# Patient Record
Sex: Female | Born: 1990 | Race: White | Hispanic: No | Marital: Married | Smoking: Never smoker
Health system: Southern US, Community
[De-identification: ages and names within clinical notes are randomized; demographics above are authoritative.]

## PROBLEM LIST (undated history)

## (undated) DIAGNOSIS — L409 Psoriasis, unspecified: Secondary | ICD-10-CM

## (undated) DIAGNOSIS — Z8489 Family history of other specified conditions: Secondary | ICD-10-CM

## (undated) DIAGNOSIS — L4050 Arthropathic psoriasis, unspecified: Secondary | ICD-10-CM

## (undated) HISTORY — DX: Psoriasis, unspecified: L40.9

## (undated) HISTORY — DX: Family history of other specified conditions: Z84.89

## (undated) HISTORY — PX: NO PAST SURGERIES: SHX2092

---

## 2010-11-26 DIAGNOSIS — K259 Gastric ulcer, unspecified as acute or chronic, without hemorrhage or perforation: Secondary | ICD-10-CM

## 2010-11-26 HISTORY — DX: Gastric ulcer, unspecified as acute or chronic, without hemorrhage or perforation: K25.9

## 2018-11-03 ENCOUNTER — Encounter: Payer: Self-pay | Admitting: Family Medicine

## 2018-11-03 ENCOUNTER — Ambulatory Visit (INDEPENDENT_AMBULATORY_CARE_PROVIDER_SITE_OTHER): Payer: Managed Care, Other (non HMO) | Admitting: Family Medicine

## 2018-11-03 VITALS — BP 118/56 | HR 57 | Resp 17 | Ht 67.0 in | Wt 158.6 lb

## 2018-11-03 DIAGNOSIS — M79644 Pain in right finger(s): Secondary | ICD-10-CM

## 2018-11-03 DIAGNOSIS — Z13220 Encounter for screening for lipoid disorders: Secondary | ICD-10-CM | POA: Diagnosis not present

## 2018-11-03 DIAGNOSIS — Z3202 Encounter for pregnancy test, result negative: Secondary | ICD-10-CM

## 2018-11-03 DIAGNOSIS — Z1389 Encounter for screening for other disorder: Secondary | ICD-10-CM | POA: Diagnosis not present

## 2018-11-03 DIAGNOSIS — Z1329 Encounter for screening for other suspected endocrine disorder: Secondary | ICD-10-CM | POA: Diagnosis not present

## 2018-11-03 DIAGNOSIS — L409 Psoriasis, unspecified: Secondary | ICD-10-CM

## 2018-11-03 DIAGNOSIS — M2550 Pain in unspecified joint: Secondary | ICD-10-CM | POA: Diagnosis not present

## 2018-11-03 DIAGNOSIS — Z131 Encounter for screening for diabetes mellitus: Secondary | ICD-10-CM | POA: Diagnosis not present

## 2018-11-03 DIAGNOSIS — Z7689 Persons encountering health services in other specified circumstances: Secondary | ICD-10-CM

## 2018-11-03 DIAGNOSIS — M7918 Myalgia, other site: Secondary | ICD-10-CM

## 2018-11-03 DIAGNOSIS — Z0000 Encounter for general adult medical examination without abnormal findings: Secondary | ICD-10-CM | POA: Diagnosis not present

## 2018-11-03 DIAGNOSIS — Z130 Encounter for screening for diseases of the blood and blood-forming organs and certain disorders involving the immune mechanism: Secondary | ICD-10-CM

## 2018-11-03 DIAGNOSIS — Z0001 Encounter for general adult medical examination with abnormal findings: Secondary | ICD-10-CM

## 2018-11-03 LAB — POCT URINALYSIS DIP (CLINITEK)
Bilirubin, UA: NEGATIVE
Blood, UA: NEGATIVE
Glucose, UA: NEGATIVE mg/dL
Ketones, POC UA: NEGATIVE mg/dL
Leukocytes, UA: NEGATIVE
NITRITE UA: NEGATIVE
PH UA: 6 (ref 5.0–8.0)
POC PROTEIN,UA: NEGATIVE
Spec Grav, UA: 1.02 (ref 1.010–1.025)
UROBILINOGEN UA: 0.2 U/dL

## 2018-11-03 LAB — POCT URINE PREGNANCY: Preg Test, Ur: NEGATIVE

## 2018-11-03 MED ORDER — MELOXICAM 15 MG PO TABS: 15.0000 mg | ORAL_TABLET | Freq: Every day | ORAL | 0 refills | Status: DC

## 2018-11-03 NOTE — Patient Instructions (Addendum)
Thank you for choosing Primary Care at [facility] to be your medical home!    Lindsay Huynh was seen by Molli Barrows, FNP today.   Lindsay Huynh's primary care provider is Scot Jun, FNP.   For the best care possible, you should try to see Molli Barrows, FNP-C whenever you come to the clinic.   We look forward to seeing you again soon!  If you have any questions about your visit today, please call us at [phone] or feel free to reach your primary care provider via [place].     Joint Pain Joint pain can be caused by many things. The joint can be bruised, infected, weak from aging, or sore from exercise. The pain will probably go away if you follow your doctor's instructions for home care. If your joint pain continues, more tests may be needed to help find the cause of your condition. Follow these instructions at home: Watch your condition for any changes. Follow these instructions as told to lessen the pain that you are feeling:  Take medicines only as told by your doctor.  Rest the sore joint for as long as told by your doctor. If your doctor tells you to, raise (elevate) the painful joint above the level of your heart while you are sitting or lying down.  Do not do things that cause pain or make the pain worse.  If told, put ice on the painful area: ? Put ice in a plastic bag. ? Place a towel between your skin and the bag. ? Leave the ice on for 20 minutes, 2-3 times per day.  Wear an elastic bandage, splint, or sling as told by your doctor. Loosen the bandage or splint if your fingers or toes lose feeling (become numb) and tingle, or if they turn cold and blue.  Begin exercising or stretching the joint as told by your doctor. Ask your doctor what types of exercise are safe for you.  Keep all follow-up visits as told by your doctor. This is important.  Contact a doctor if:  Your pain gets worse and medicine does not help it.  Your joint pain does not get  better in 3 days.  You have more bruising or swelling.  You have a fever.  You lose 10 pounds (4.5 kg) or more without trying. Get help right away if:  You are not able to move the joint.  Your fingers or toes become numb or they turn cold and blue. This information is not intended to replace advice given to you by your health care provider. Make sure you discuss any questions you have with your health care provider. Document Released: 10/31/2009 Document Revised: 04/19/2016 Document Reviewed: 08/24/2014 Elsevier Interactive Patient Education  2018 [place] Maintenance, Female Adopting a healthy lifestyle and getting preventive care can go a long way to promote health and wellness. Talk with your health care provider about what schedule of regular examinations is right for you. This is a good chance for you to check in with your provider about disease prevention and staying healthy. In between checkups, there are plenty of things you can do on your own. Experts have done a lot of research about which lifestyle changes and preventive measures are most likely to keep you healthy. Ask your health care provider for more information. Weight and diet Eat a healthy diet  Be sure to include plenty of vegetables, fruits, low-fat dairy products, and lean protein.  Do not eat a lot  of foods high in solid fats, added sugars, or salt.  Get regular exercise. This is one of the most important things you can do for your health. ? Most adults should exercise for at least 150 minutes each week. The exercise should increase your heart rate and make you sweat (moderate-intensity exercise). ? Most adults should also do strengthening exercises at least twice a week. This is in addition to the moderate-intensity exercise.  Maintain a healthy weight  Body mass index (BMI) is a measurement that can be used to identify possible weight problems. It estimates body fat based on height and weight.  Your health care provider can help determine your BMI and help you achieve or maintain a healthy weight.  For females 88 years of age and older: ? A BMI below 18.5 is considered underweight. ? A BMI of 18.5 to 24.9 is normal. ? A BMI of 25 to 29.9 is considered overweight. ? A BMI of 30 and above is considered obese.  Watch levels of cholesterol and blood lipids  You should start having your blood tested for lipids and cholesterol at 28 years of age, then have this test every 5 years.  You may need to have your cholesterol levels checked more often if: ? Your lipid or cholesterol levels are high. ? You are older than 28 years of age. ? You are at high risk for heart disease.  Cancer screening Lung Cancer  Lung cancer screening is recommended for adults 34-64 years old who are at high risk for lung cancer because of a history of smoking.  A yearly low-dose CT scan of the lungs is recommended for people who: ? Currently smoke. ? Have quit within the past 15 years. ? Have at least a 30-pack-year history of smoking. A pack year is smoking an average of one pack of cigarettes a day for 1 year.  Yearly screening should continue until it has been 15 years since you quit.  Yearly screening should stop if you develop a health problem that would prevent you from having lung cancer treatment.  Breast Cancer  Practice breast self-awareness. This means understanding how your breasts normally appear and feel.  It also means doing regular breast self-exams. Let your health care provider know about any changes, no matter how small.  If you are in your 20s or 30s, you should have a clinical breast exam (CBE) by a health care provider every 1-3 years as part of a regular health exam.  If you are 46 or older, have a CBE every year. Also consider having a breast X-ray (mammogram) every year.  If you have a family history of breast cancer, talk to your health care provider about genetic  screening.  If you are at high risk for breast cancer, talk to your health care provider about having an MRI and a mammogram every year.  Breast cancer gene (BRCA) assessment is recommended for women who have family members with BRCA-related cancers. BRCA-related cancers include: ? Breast. ? Ovarian. ? Tubal. ? Peritoneal cancers.  Results of the assessment will determine the need for genetic counseling and BRCA1 and BRCA2 testing.  Cervical Cancer Your health care provider may recommend that you be screened regularly for cancer of the pelvic organs (ovaries, uterus, and vagina). This screening involves a pelvic examination, including checking for microscopic changes to the surface of your cervix (Pap test). You may be encouraged to have this screening done every 3 years, beginning at age 43.  For women  ages 50-65, health care providers may recommend pelvic exams and Pap testing every 3 years, or they may recommend the Pap and pelvic exam, combined with testing for human papilloma virus (HPV), every 5 years. Some types of HPV increase your risk of cervical cancer. Testing for HPV may also be done on women of any age with unclear Pap test results.  Other health care providers may not recommend any screening for nonpregnant women who are considered low risk for pelvic cancer and who do not have symptoms. Ask your health care provider if a screening pelvic exam is right for you.  If you have had past treatment for cervical cancer or a condition that could lead to cancer, you need Pap tests and screening for cancer for at least 20 years after your treatment. If Pap tests have been discontinued, your risk factors (such as having a new sexual partner) need to be reassessed to determine if screening should resume. Some women have medical problems that increase the chance of getting cervical cancer. In these cases, your health care provider may recommend more frequent screening and Pap  tests.  Colorectal Cancer  This type of cancer can be detected and often prevented.  Routine colorectal cancer screening usually begins at 28 years of age and continues through 28 years of age.  Your health care provider may recommend screening at an earlier age if you have risk factors for colon cancer.  Your health care provider may also recommend using home test kits to check for hidden blood in the stool.  A small camera at the end of a tube can be used to examine your colon directly (sigmoidoscopy or colonoscopy). This is done to check for the earliest forms of colorectal cancer.  Routine screening usually begins at age 4.  Direct examination of the colon should be repeated every 5-10 years through 28 years of age. However, you may need to be screened more often if early forms of precancerous polyps or small growths are found.  Skin Cancer  Check your skin from head to toe regularly.  Tell your health care provider about any new moles or changes in moles, especially if there is a change in a mole's shape or color.  Also tell your health care provider if you have a mole that is larger than the size of a pencil eraser.  Always use sunscreen. Apply sunscreen liberally and repeatedly throughout the day.  Protect yourself by wearing long sleeves, pants, a wide-brimmed hat, and sunglasses whenever you are outside.  Heart disease, diabetes, and high blood pressure  High blood pressure causes heart disease and increases the risk of stroke. High blood pressure is more likely to develop in: ? People who have blood pressure in the high end of the normal range (130-139/85-89 mm Hg). ? People who are overweight or obese. ? People who are African American.  If you are 58-46 years of age, have your blood pressure checked every 3-5 years. If you are 53 years of age or older, have your blood pressure checked every year. You should have your blood pressure measured twice-once when you are at  a hospital or clinic, and once when you are not at a hospital or clinic. Record the average of the two measurements. To check your blood pressure when you are not at a hospital or clinic, you can use: ? An automated blood pressure machine at a pharmacy. ? A home blood pressure monitor.  If you are between 55 years and 74  years old, ask your health care provider if you should take aspirin to prevent strokes.  Have regular diabetes screenings. This involves taking a blood sample to check your fasting blood sugar level. ? If you are at a normal weight and have a low risk for diabetes, have this test once every three years after 28 years of age. ? If you are overweight and have a high risk for diabetes, consider being tested at a younger age or more often. Preventing infection Hepatitis B  If you have a higher risk for hepatitis B, you should be screened for this virus. You are considered at high risk for hepatitis B if: ? You were born in a country where hepatitis B is common. Ask your health care provider which countries are considered high risk. ? Your parents were born in a high-risk country, and you have not been immunized against hepatitis B (hepatitis B vaccine). ? You have HIV or AIDS. ? You use needles to inject street drugs. ? You live with someone who has hepatitis B. ? You have had sex with someone who has hepatitis B. ? You get hemodialysis treatment. ? You take certain medicines for conditions, including cancer, organ transplantation, and autoimmune conditions.  Hepatitis C  Blood testing is recommended for: ? Everyone born from [birth year] through 1966. ? Anyone with known risk factors for hepatitis C.  Sexually transmitted infections (STIs)  You should be screened for sexually transmitted infections (STIs) including gonorrhea and chlamydia if: ? You are sexually active and are younger than 28 years of age. ? You are older than 28 years of age and your health care provider tells  you that you are at risk for this type of infection. ? Your sexual activity has changed since you were last screened and you are at an increased risk for chlamydia or gonorrhea. Ask your health care provider if you are at risk.  If you do not have HIV, but are at risk, it may be recommended that you take a prescription medicine daily to prevent HIV infection. This is called pre-exposure prophylaxis (PrEP). You are considered at risk if: ? You are sexually active and do not regularly use condoms or know the HIV status of your partner(s). ? You take drugs by injection. ? You are sexually active with a partner who has HIV.  Talk with your health care provider about whether you are at high risk of being infected with HIV. If you choose to begin PrEP, you should first be tested for HIV. You should then be tested every 3 months for as long as you are taking PrEP. Pregnancy  If you are premenopausal and you may become pregnant, ask your health care provider about preconception counseling.  If you may become pregnant, take 400 to 800 micrograms (mcg) of folic acid every day.  If you want to prevent pregnancy, talk to your health care provider about birth control (contraception). Osteoporosis and menopause  Osteoporosis is a disease in which the bones lose minerals and strength with aging. This can result in serious bone fractures. Your risk for osteoporosis can be identified using a bone density scan.  If you are 63 years of age or older, or if you are at risk for osteoporosis and fractures, ask your health care provider if you should be screened.  Ask your health care provider whether you should take a calcium or vitamin D supplement to lower your risk for osteoporosis.  Menopause may have certain physical symptoms and risks.  Hormone replacement therapy may reduce some of these symptoms and risks. Talk to your health care provider about whether hormone replacement therapy is right for  you. Follow these instructions at home:  Schedule regular health, dental, and eye exams.  Stay current with your immunizations.  Do not use any tobacco products including cigarettes, chewing tobacco, or electronic cigarettes.  If you are pregnant, do not drink alcohol.  If you are breastfeeding, limit how much and how often you drink alcohol.  Limit alcohol intake to no more than 1 drink per day for nonpregnant women. One drink equals 12 ounces of beer, 5 ounces of wine, or 1 ounces of hard liquor.  Do not use street drugs.  Do not share needles.  Ask your health care provider for help if you need support or information about quitting drugs.  Tell your health care provider if you often feel depressed.  Tell your health care provider if you have ever been abused or do not feel safe at home. This information is not intended to replace advice given to you by your health care provider. Make sure you discuss any questions you have with your health care provider. Document Released: 05/28/2011 Document Revised: 04/19/2016 Document Reviewed: 08/16/2015 Elsevier Interactive Patient Education  [employer].

## 2018-11-03 NOTE — Progress Notes (Signed)
Lindsay Huynh, is a 27 y.o. female  [id number]  [id number]  DOB - 20-Feb-1991  CC:  Chief Complaint  Patient presents with  . Establish Care  . Joint Pain    mostly in her fingers/toes. toes have been bothering her awhile but the finger pain has been over the last 2.5 months. unsure if it's related to her psoriasis. would like rheum referral       HPI: Lindsay Huynh is a 27 y.o. female is here today to establish care.   Lindsay Huynh does not have a problem list on file.   Today's visit:  Lindsay Huynh is here to establish care. She has had no recent follow-up with PCP. She has lived in [place] for approximately 3 years and has only been followed by gynecology. She takes oral contraceptives for birth control. 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 Has seen dermatology once. Lesions are localized to her scalp when they occur.  She complains today of idiopathic joint pain which has waxed and waned for over 1 year. Pain originated in toes of bilateral toes and persisted for sometime, and resolved. Pain subsequently developed in left middle finger and is now in the left index finger at the PIP joint. Joint is warm and reddened when pain is at it's most intense. Complains of ankle crepitus without pain. Occasional knee pain which she related to history of playing softball. Denies occurrence of rashes or fever. No family history of autoimmune disorders, no hair thinning or rash. Suffers from occasional acid reflux symptoms which are managed with OTC medications.   Patient denies new headaches, chest pain, abdominal pain, nausea, new weakness , numbness or tingling, SOB, edema, or worrisome cough.     Current medications: Current Outpatient Medications:  .  TRI-PREVIFEM 0.18/0.215/0.25 MG-35 MCG tablet, Take 1 tablet by mouth daily., Disp: , Rfl: 3   Pertinent family medical history: family history is not on file.   No Known  Allergies  Social History   Socioeconomic History  . Marital status: Married    Spouse name: Not on file  . Number of children: Not on file  . Years of education: Not on file  . Highest education level: Not on file  Occupational History  . Not on file  Social Needs  . Financial resource strain: Not on file  . Food insecurity:    Worry: Not on file    Inability: Not on file  . Transportation needs:    Medical: Not on file    Non-medical: Not on file  Tobacco Use  . Smoking status: Never Smoker  . Smokeless tobacco: Never Used  Substance and Sexual Activity  . Alcohol use: Yes    Comment: socially  . Drug use: Never  . Sexual activity: Not on file  Lifestyle  . Physical activity:    Days per week: Not on file    Minutes per session: Not on file  . Stress: Not on file  Relationships  . Social connections:    Talks on phone: Not on file    Gets together: Not on file    Attends religious service: Not on file    Active member of club or organization: Not on file    Attends meetings of clubs or organizations: Not on file    Relationship status: Not on file  . Intimate partner violence:    Fear of current or ex partner: Not on file    Emotionally  abused: Not on file    Physically abused: Not on file    Forced sexual activity: Not on file  Other Topics Concern  . Not on file  Social History Narrative  . Not on file    Review of Systems: Constitutional: Negative for fever, chills, diaphoresis, activity change, appetite change and fatigue. HENT: Negative for ear pain, nosebleeds, congestion, facial swelling, rhinorrhea, neck pain, neck stiffness and ear discharge.  Eyes: Negative for pain, discharge, redness, itching and visual disturbance. Respiratory: Negative for cough, choking, chest tightness, shortness of breath, wheezing and stridor.  Cardiovascular: Negative for chest pain, palpitations and leg swelling. Gastrointestinal: occasional acid reflux   Musculoskeletal: Negative for back pain, joint swelling, arthralgia and gait problem. Neurological: Negative for dizziness, tremors, seizures, syncope, facial asymmetry, speech difficulty, weakness, light-headedness, numbness and headaches.  Hematological: Negative for adenopathy. Does not bruise/bleed easily. Psychiatric/Behavioral: Negative for hallucinations, behavioral problems, confusion, dysphoric mood, decreased concentration and agitation.    Objective:   Vitals:   11/03/18 0847  BP: (!) 118/56  Pulse: (!) 57  Resp: 17  SpO2: 99%    BP Readings from Last 3 Encounters:  11/03/18 (!) 118/56    Filed Weights   11/03/18 0847  Weight: 158 lb 9.6 oz (71.9 kg)      Physical Exam: Constitutional: Patient appears well-developed and well-nourished. No distress. HENT: Normocephalic, atraumatic, External right and left ear normal. Oropharynx is clear and moist.  Eyes: Conjunctivae and EOM are normal. PERRLA, no scleral icterus. Neck: Normal ROM. Neck supple. No JVD. No tracheal deviation. No thyromegaly. CVS: RRR, S1/S2 +, no murmurs, no gallops, no carotid bruit.  Pulmonary: Effort and breath sounds normal, no stridor, rhonchi, wheezes, rales.  Abdominal: Soft. BS +, no distension, tenderness, rebound or guarding.  Musculoskeletal: Normal range of motion. No edema and no tenderness.  Neuro: Alert. Normal muscle tone coordination. Normal gait. BUE and BLE strength 5/5. Bilateral hand grips symmetrical. No cranial nerve deficit. Skin: Skin is warm and dry. No rash noted. Not diaphoretic. No erythema. No pallor. Psychiatric: Normal mood and affect. Behavior, judgment, thought content normal.     Assessment and plan:  1. Encounter to establish care 2. Generalized joint pain, rule out autoimmune etiology vs gout.  Will check the following: - VITAMIN D 25 Hydroxy (Vit-D Deficiency, Fractures); Standing - RA Qn+CCP(IgG/A)+SjoSSA+SjoSSB - Sedimentation Rate - Uric Acid -  C-reactive protein - Lupus (SLE) Analysis  3. Screening for thyroid disorder - Thyroid Panel With TSH  4. Screening for diabetes mellitus - Hemoglobin A1c - Comprehensive metabolic panel  5. Screening, lipid - Lipid panel  6. Screening for deficiency anemia - CBC with Differential  7. Annual physical exam Age appropriate anticipatory guidance provided  - POCT URINALYSIS DIP (CLINITEK) - POCT urine pregnancy   Will follow-up with labs results and refer to rheumatology or orthopedics as warranted.  Return in about 1 year (around 11/04/2019) for Complete Physical Exam or sooner if needed .   The patient was given clear instructions to go to ER or return to medical center if symptoms don't improve, worsen or new problems develop. The patient verbalized understanding. The patient was advised  to call and obtain lab results if they haven't heard anything from out office within 7-10 business days.  Joaquin CourtsKimberly Bindu Docter, FNP [facility] [address], [place] [id number] [phone]: [phone]    This note has been created with Dragon speech recognition software and Paediatric nursesmart phrase technology. Any transcriptional errors are  unintentional.

## 2018-11-04 LAB — LIPID PANEL
CHOL/HDL RATIO: 3.1 ratio (ref 0.0–4.4)
Cholesterol, Total: 165 mg/dL (ref 100–199)
HDL: 54 mg/dL (ref >39)
LDL Calculated: 89 mg/dL (ref 0–99)
Triglycerides: 110 mg/dL (ref 0–149)
VLDL Cholesterol Cal: 22 mg/dL (ref 5–40)

## 2018-11-04 LAB — COMPREHENSIVE METABOLIC PANEL
A/G RATIO: 1.7 (ref 1.2–2.2)
ALT: 10 IU/L (ref 0–32)
AST: 20 IU/L (ref 0–40)
Albumin: 4.3 g/dL (ref 3.5–5.5)
Alkaline Phosphatase: 61 IU/L (ref 39–117)
BILIRUBIN TOTAL: 0.6 mg/dL (ref 0.0–1.2)
BUN/Creatinine Ratio: 11 (ref 9–23)
BUN: 10 mg/dL (ref 6–20)
CHLORIDE: 104 mmol/L (ref 96–106)
CO2: 20 mmol/L (ref 20–29)
Calcium: 9.5 mg/dL (ref 8.7–10.2)
Creatinine, Ser: 0.93 mg/dL (ref 0.57–1.00)
GFR calc Af Amer: 97 mL/min/{1.73_m2} (ref >59)
GFR calc non Af Amer: 84 mL/min/{1.73_m2} (ref >59)
Globulin, Total: 2.5 g/dL (ref 1.5–4.5)
Glucose: 81 mg/dL (ref 65–99)
Potassium: 5.6 mmol/L — ABNORMAL HIGH (ref 3.5–5.2)
Sodium: 143 mmol/L (ref 134–144)
Total Protein: 6.8 g/dL (ref 6.0–8.5)

## 2018-11-04 LAB — LUPUS ANTICOAGULANT PANEL

## 2018-11-04 LAB — CBC WITH DIFFERENTIAL/PLATELET
Basophils Absolute: 0.1 10*3/uL (ref 0.0–0.2)
Basos: 1 %
EOS (ABSOLUTE): 0.4 10*3/uL (ref 0.0–0.4)
Eos: 6 %
HEMOGLOBIN: 12.8 g/dL (ref 11.1–15.9)
Hematocrit: 40 % (ref 34.0–46.6)
Immature Grans (Abs): 0 10*3/uL (ref 0.0–0.1)
Immature Granulocytes: 0 %
Lymphocytes Absolute: 2 10*3/uL (ref 0.7–3.1)
Lymphs: 27 %
MCH: 29.3 pg (ref 26.6–33.0)
MCHC: 32 g/dL (ref 31.5–35.7)
MCV: 92 fL (ref 79–97)
MONOCYTES: 9 %
Monocytes Absolute: 0.7 10*3/uL (ref 0.1–0.9)
Neutrophils Absolute: 4.1 10*3/uL (ref 1.4–7.0)
Neutrophils: 57 %
Platelets: 359 10*3/uL (ref 150–450)
RBC: 4.37 x10E6/uL (ref 3.77–5.28)
RDW: 12.6 % (ref 12.3–15.4)
WBC: 7.2 10*3/uL (ref 3.4–10.8)

## 2018-11-04 LAB — SEDIMENTATION RATE: Sed Rate: 8 mm/hr (ref 0–32)

## 2018-11-04 LAB — HEMOGLOBIN A1C
Est. average glucose Bld gHb Est-mCnc: 111 mg/dL
Hgb A1c MFr Bld: 5.5 % (ref 4.8–5.6)

## 2018-11-04 LAB — LUPUS (SLE) ANALYSIS
Anti Nuclear Antibody(ANA): NEGATIVE
Anti-striation Abs: NEGATIVE
Complement C4, Serum: 23 mg/dL (ref 14–44)
ENA RNP Ab: <0.2 AI (ref 0.0–0.9)
ENA SSA (RO) Ab: <0.2 AI (ref 0.0–0.9)
ENA SSB (LA) Ab: <0.2 AI (ref 0.0–0.9)
Parietal Cell Ab: 7.6 Units (ref 0.0–20.0)
Scleroderma SCL-70: <0.2 AI (ref 0.0–0.9)
Smooth Muscle Ab: 12 Units (ref 0–19)
Thyroperoxidase Ab SerPl-aCnc: 34 IU/mL (ref 0–34)
dsDNA Ab: <1 IU/mL (ref 0–9)

## 2018-11-04 LAB — RA QN+CCP(IGG/A)+SJOSSA+SJOSSB
Cyclic Citrullin Peptide Ab: 8 units (ref 0–19)
ENA SSA (RO) Ab: <0.2 AI (ref 0.0–0.9)
ENA SSB (LA) Ab: <0.2 AI (ref 0.0–0.9)
Rheumatoid fact SerPl-aCnc: <10 IU/mL (ref 0.0–13.9)

## 2018-11-04 LAB — THYROID PANEL WITH TSH
Free Thyroxine Index: 2 (ref 1.2–4.9)
T3 Uptake Ratio: 26 % (ref 24–39)
T4 TOTAL: 7.8 ug/dL (ref 4.5–12.0)
TSH: 2.7 u[IU]/mL (ref 0.450–4.500)

## 2018-11-04 LAB — C-REACTIVE PROTEIN: CRP: 7 mg/L (ref 0–10)

## 2018-11-04 LAB — URIC ACID: Uric Acid: 3.4 mg/dL (ref 2.5–7.1)

## 2018-11-05 ENCOUNTER — Telehealth: Payer: Self-pay | Admitting: Family Medicine

## 2018-11-05 DIAGNOSIS — E875 Hyperkalemia: Secondary | ICD-10-CM

## 2018-11-05 DIAGNOSIS — M25542 Pain in joints of left hand: Secondary | ICD-10-CM

## 2018-11-05 MED ORDER — FUROSEMIDE 20 MG PO TABS: 20.0000 mg | ORAL_TABLET | Freq: Every day | ORAL | 0 refills | Status: DC

## 2018-11-05 NOTE — Telephone Encounter (Signed)
Rheumatoid factor, lupus panel, sed rate, C-reactive protein, and uric acid levels were all normal.  These test analyze the presence of inflammation within the body and all were negative.  I would recommend following up with an image of your index finger to evaluate if this is a musculoskeletal problem.  Given these results rheumatology would not be an appropriate referral however orthopedics would be more appropriate if the joint pain continues to be persistent or pain is level now that you would like an orthopedics evaluation. Please advise if you would like imaging and othopedic referral. All other labs were normal with the exception you potassium level was mildly elevated. I would like for her to take 3 days of furosemide 20 mg and return 1 week for a repeat potassium check. Medication sent to pharmacy on file and lab is pending

## 2018-11-07 NOTE — Addendum Note (Signed)
Addended by: Bing NeighborsHARRIS, Vianna Venezia S on: 11/07/2018 04:06 PM   Modules accepted: Orders

## 2018-11-07 NOTE — Telephone Encounter (Signed)
Patient notified of lab results & recommendations. She states that she would like to have the xray done first & then proceed with orthopedics if necessary.

## 2018-11-07 NOTE — Telephone Encounter (Signed)
Order placed for imaging. If [facility] imaging is set-up patient and have [facility] performed at our office [facility]. Notify patient

## 2018-11-10 NOTE — Telephone Encounter (Signed)
Patient can make an appt here for xray.

## 2018-11-23 ENCOUNTER — Other Ambulatory Visit: Payer: Self-pay | Admitting: Family Medicine

## 2018-11-24 NOTE — Telephone Encounter (Signed)
[facility] does not currently refill prescriptions for this practice. / [facility]

## 2018-12-17 ENCOUNTER — Ambulatory Visit: Payer: Self-pay

## 2018-12-17 ENCOUNTER — Ambulatory Visit (INDEPENDENT_AMBULATORY_CARE_PROVIDER_SITE_OTHER): Payer: Managed Care, Other (non HMO)

## 2018-12-17 DIAGNOSIS — M25542 Pain in joints of left hand: Secondary | ICD-10-CM | POA: Diagnosis not present

## 2018-12-17 DIAGNOSIS — M85842 Other specified disorders of bone density and structure, left hand: Secondary | ICD-10-CM | POA: Diagnosis not present

## 2018-12-18 ENCOUNTER — Telehealth: Payer: Self-pay | Admitting: Family Medicine

## 2018-12-18 NOTE — Telephone Encounter (Signed)
Contact patient to advise recent imaging of the index finger did reveal mild decreased loss of bone density.  I do believe this warrants further evaluation given her age.  I am referring her to orthopedic specialty for further work-up and for a  second opinion of cause of loss in bone mass.

## 2018-12-18 NOTE — Telephone Encounter (Signed)
Patient notified of lab results & recommendations. Expressed understanding. Aware she will be being contacted by Ortho to set up an appointment.

## 2018-12-19 NOTE — Progress Notes (Unsigned)
Patient in office for xray.

## 2018-12-25 ENCOUNTER — Telehealth: Payer: Self-pay | Admitting: Family Medicine

## 2018-12-25 NOTE — Telephone Encounter (Signed)
Referral placed to orthopedic speciality that has access to Epic. No need to fax imaging. Thanks!

## 2018-12-25 NOTE — Telephone Encounter (Signed)
I have printed xray report & last office note in preparation to fax to Ortho.  Can you enter referral please? Thanks.

## 2018-12-25 NOTE — Addendum Note (Signed)
Addended by: Bing NeighborsHARRIS, Debroh Sieloff S on: 12/25/2018 08:15 PM   Modules accepted: Orders

## 2018-12-25 NOTE — Telephone Encounter (Signed)
Patient called requesting status of referral to ortho, referral has not been sent in yet please follow up.

## 2019-01-05 ENCOUNTER — Ambulatory Visit (INDEPENDENT_AMBULATORY_CARE_PROVIDER_SITE_OTHER): Payer: Managed Care, Other (non HMO) | Admitting: Orthopaedic Surgery

## 2019-01-05 ENCOUNTER — Encounter (INDEPENDENT_AMBULATORY_CARE_PROVIDER_SITE_OTHER): Payer: Self-pay | Admitting: Orthopaedic Surgery

## 2019-01-05 VITALS — BP 120/60 | HR 65 | Ht 67.0 in | Wt 160.0 lb

## 2019-01-05 DIAGNOSIS — M79642 Pain in left hand: Secondary | ICD-10-CM | POA: Diagnosis not present

## 2019-01-05 NOTE — Progress Notes (Signed)
Office Visit Note   Patient: Lindsay Huynh           Date of Birth: 1991-08-02           MRN: [id number] Visit Date: 01/05/2019              Requested by: Bing Neighbors, FNP [address] [place], [place] [address] PCP: Bing Neighbors, FNP   Assessment & Plan: Visit Diagnoses:  1. 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  Discussed with Dr.Deveshwar.  Will order additional lab follow-up with Dr Corliss Skains  Follow-Up Instructions: Return with Dr Corliss Skains after lab.   Orders:  Orders Placed This Encounter  Procedures  . CK  . IgG, IgA, IgM  . QuantiFERON-TB Gold Plus  . Serum protein electrophoresis with reflex  . Glucose 6 phosphate dehydrogenase  . HIV Antibody (routine testing w rflx)  . Hepatitis panel, acute   No orders of the defined types were placed in this encounter.     Procedures: No procedures performed   Clinical Data: No additional findings.   Subjective: Chief Complaint  Patient presents with  . Left Hand - Pain  Patient presents today with left index finger pain and swelling. She said that it has been hurting for 3-6months. No injury. She said that the pain is constant, but worse in the morning and when it is cold. She saw her PCP Dr.Harris in December of 2019. She had an x-ray on 12/17/2018 of her left hand. She is not taking anything for pain, but has tried a magnesium cream on her cream. She has also noticed some right ankle swelling and pain in the last two weeks. She said that it hurts more in the morning and wonders if they are connected. Has history of psoriatic lesions in her scalp.  No specific injury or trauma.  Also has noted some pain in the area of her sacroiliac joints. I did review the films of her left hand on the PACS system.  No obvious deformity or irregularity about the PIP joint of the left index finger  HPI  Review of  Systems   Objective: Vital Signs: BP 120/60   Pulse 65   Ht 5\' 7"  (1.702 m)   Wt 160 lb (72.6 kg)   BMI 25.06 kg/m   Physical Exam Constitutional:      Appearance: She is well-developed.  Eyes:     Pupils: Pupils are equal, round, and reactive to light.  Pulmonary:     Effort: Pulmonary effort is normal.  Skin:    General: Skin is warm and dry.  Neurological:     Mental Status: She is alert and oriented to person, place, and time.  Psychiatric:        Behavior: Behavior normal.     Ortho Exam awake alert and oriented x3.  Cuff pulse sitting.  Examination left hand reveals some mild swelling of the PIP joint.  Able to make a full fist but a little "tight".  No pitting of the nails.  No MP joint pain or DIP joint pain.  Neurologically intact.  No other PIP DIP or MP joint discomfort in left hand or right.  Right ankle with some swelling along the medial more than the lateral malleolus that she might even have a small ganglion cyst along the malleolus.  Neurologically intact.  Very minimal discomfort.  No nail problems  Specialty Comments:  No specialty comments available.  Imaging: No results found.   PMFS History: Patient Active Problem List   Diagnosis Date Noted  . Generalized joint pain 11/03/2018  . Psoriasis 11/03/2018   Past Medical History:  Diagnosis Date  . Psoriasis     History reviewed. No pertinent family history.  History reviewed. No pertinent surgical history. Social History   Occupational History  . Not on file  Tobacco Use  . Smoking status: Never Smoker  . Smokeless tobacco: Never Used  Substance and Sexual Activity  . Alcohol use: Yes    Comment: socially  . Drug use: Never  . Sexual activity: Not on file

## 2019-01-07 LAB — QUANTIFERON-TB GOLD PLUS
Mitogen-NIL: >10 IU/mL
NIL: 0.05 IU/mL
QUANTIFERON-TB GOLD PLUS: NEGATIVE
TB1-NIL: <0 IU/mL
TB2-NIL: <0 IU/mL

## 2019-01-07 LAB — HEPATITIS PANEL, ACUTE
Hep A IgM: NONREACTIVE
Hep B C IgM: NONREACTIVE
Hepatitis B Surface Ag: NONREACTIVE
Hepatitis C Ab: NONREACTIVE
SIGNAL TO CUT-OFF: 0.02 (ref <1.00)

## 2019-01-07 LAB — PROTEIN ELECTROPHORESIS, SERUM, WITH REFLEX
ALPHA 1: 0.4 g/dL — AB (ref 0.2–0.3)
ALPHA 2: 0.7 g/dL (ref 0.5–0.9)
Albumin ELP: 3.9 g/dL (ref 3.8–4.8)
Beta 2: 0.3 g/dL (ref 0.2–0.5)
Beta Globulin: 0.5 g/dL (ref 0.4–0.6)
Gamma Globulin: 1.1 g/dL (ref 0.8–1.7)
Total Protein: 6.9 g/dL (ref 6.1–8.1)

## 2019-01-07 LAB — IGG, IGA, IGM
IgG (Immunoglobin G), Serum: 1162 mg/dL (ref 600–1640)
IgM, Serum: 155 mg/dL (ref 50–300)
Immunoglobulin A: 135 mg/dL (ref 47–310)

## 2019-01-07 LAB — CK: Total CK: 58 U/L (ref 29–143)

## 2019-01-07 LAB — HIV ANTIBODY (ROUTINE TESTING W REFLEX): HIV: NONREACTIVE

## 2019-01-07 LAB — GLUCOSE 6 PHOSPHATE DEHYDROGENASE: G-6PDH: 16.4 U/g Hgb (ref 7.0–20.5)

## 2019-02-11 ENCOUNTER — Ambulatory Visit: Payer: Managed Care, Other (non HMO) | Admitting: Rheumatology

## 2019-02-26 IMAGING — DX DG HAND COMPLETE 3+V*L*
3 series · 3 of 3 positions shown · non-contrast
Comparison: None.

CLINICAL DATA: Swelling and joint pain for 2 months. Swelling and
irritation around the PIP joint of the second digit.

EXAM:
LEFT HAND - COMPLETE 3+ VIEW

[hand pa]
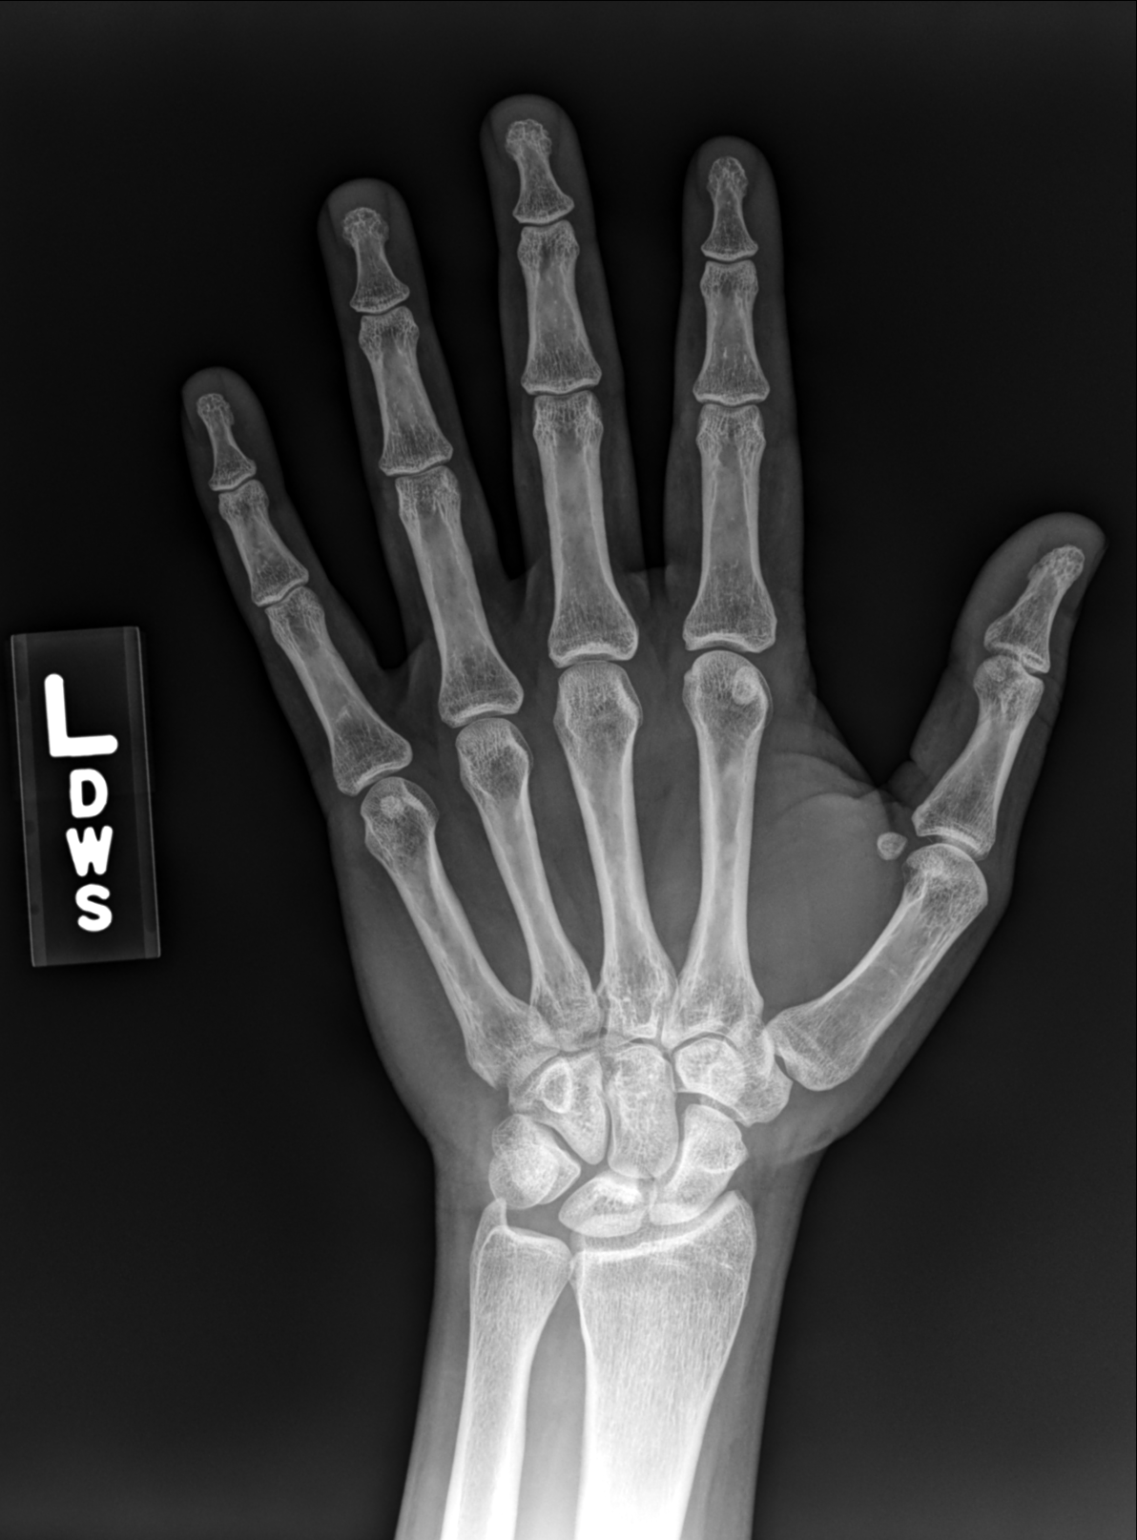

[hand mlo]
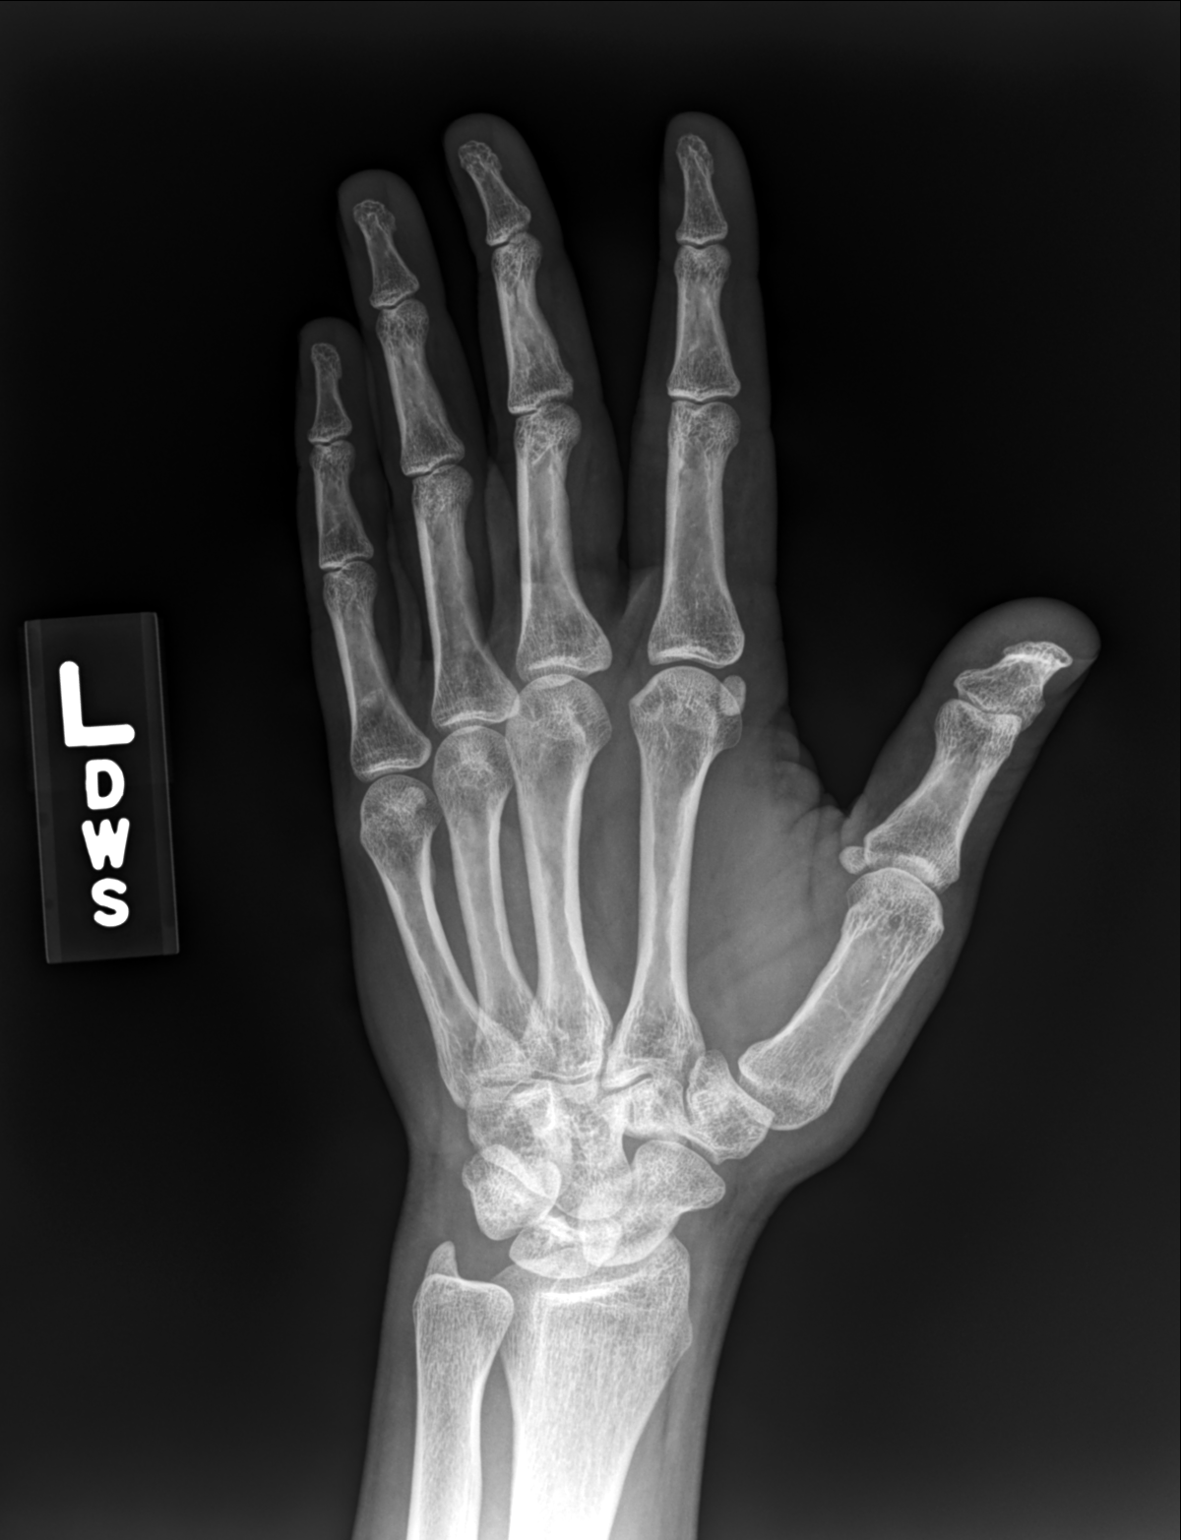

[hand lat]
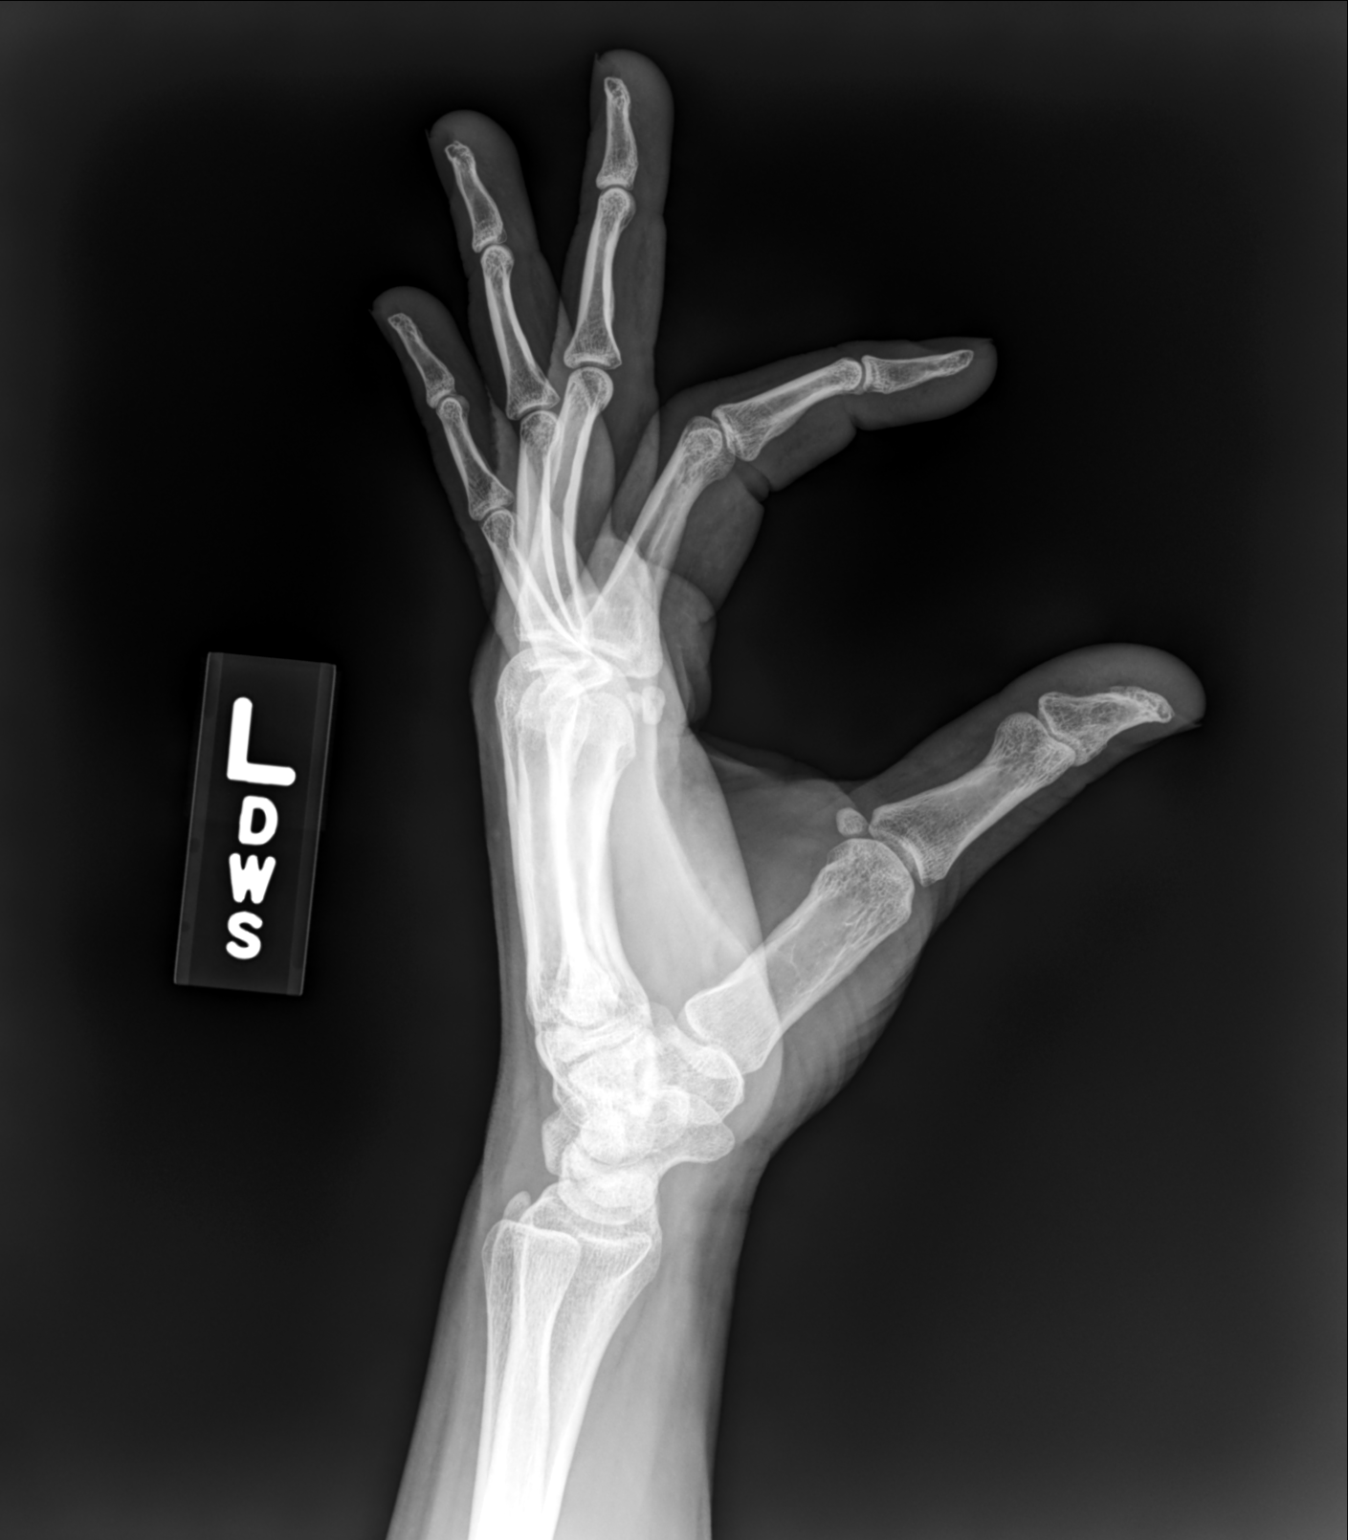

[3 of 3 positions shown; findings below may reference images not displayed]

FINDINGS: Slight periarticular osteopenia without erosive change or
periostitis. No acute fracture or suspicious osseous lesions. Carpal
rows are maintained. The distal radius and ulna are unremarkable. No
soft tissue mass or mineralization.
IMPRESSION: Slight periarticular osteopenia without erosive change or
periostitis. Findings are nonspecific but can be an early
radiographic sign of inflammatory arthropathy.

## 2019-03-09 ENCOUNTER — Ambulatory Visit: Payer: Managed Care, Other (non HMO) | Admitting: Rheumatology

## 2019-11-27 NOTE — L&D Delivery Note (Signed)
Operative Delivery Note Patient had been pushing effectively x 2 hours.  Recurrent variable decelerations with each contraction and loss of variability.  I recommend vacuum assistance to expedite delivery and patient accepts.    Verbal consent: obtained from patient.  Risks and benefits discussed in detail.  Risks include, but are not limited to the risks of anesthesia, bleeding, infection, damage to maternal tissues, fetal cephalhematoma.  There is also the risk of inability to effect vaginal delivery of the head, or shoulder dystocia that cannot be resolved by established maneuvers, leading to the need for emergency cesarean section.  At 3:37 PM a viable female was delivered via Vaginal, Vacuum Investment banker, operational).  Presentation: vertex; Position: Left,, Occiput,, Anterior; Station: +3.  Total of 5 contractions with 3 push/pull efforts each and steady descent; 2 pop offs.  APGAR: 8, 9; weight pending.   Placenta status: S, I.   3V Cord with the following complications: none.  Cord pH: n/a  Anesthesia:  CLEA Instruments: Kiwi vacuum Episiotomy: None Lacerations: 2nd degree Suture Repair: 3.0 vicryl rapide Est. Blood Loss (mL):  250  Mom to postpartum.  Baby to Couplet care / Skin to Skin.  Mitchel Honour 06/02/2020, 3:59 PM

## 2019-12-09 LAB — OB RESULTS CONSOLE RUBELLA ANTIBODY, IGM: Rubella: IMMUNE

## 2019-12-09 LAB — HEPATITIS C ANTIBODY: HCV Ab: NEGATIVE

## 2019-12-09 LAB — OB RESULTS CONSOLE HEPATITIS B SURFACE ANTIGEN: Hepatitis B Surface Ag: NEGATIVE

## 2019-12-09 LAB — OB RESULTS CONSOLE RPR: RPR: NONREACTIVE

## 2019-12-09 LAB — OB RESULTS CONSOLE GC/CHLAMYDIA
Chlamydia: NEGATIVE
Gonorrhea: NEGATIVE

## 2019-12-09 LAB — OB RESULTS CONSOLE ANTIBODY SCREEN: Antibody Screen: NEGATIVE

## 2019-12-09 LAB — OB RESULTS CONSOLE ABO/RH: RH Type: POSITIVE

## 2020-05-04 LAB — OB RESULTS CONSOLE GBS: GBS: NEGATIVE

## 2020-05-25 ENCOUNTER — Encounter (HOSPITAL_COMMUNITY): Payer: Self-pay | Admitting: *Deleted

## 2020-05-25 ENCOUNTER — Telehealth (HOSPITAL_COMMUNITY): Payer: Self-pay | Admitting: *Deleted

## 2020-05-25 NOTE — Telephone Encounter (Signed)
Preadmission screen  

## 2020-05-31 ENCOUNTER — Other Ambulatory Visit (HOSPITAL_COMMUNITY)
Admission: RE | Admit: 2020-05-31 | Discharge: 2020-05-31 | Disposition: A | Discharge status: Home / Self Care | Payer: Managed Care, Other (non HMO) | Source: Ambulatory Visit | Attending: Obstetrics & Gynecology | Admitting: Obstetrics & Gynecology

## 2020-05-31 LAB — SARS CORONAVIRUS 2 (TAT 6-24 HRS): SARS Coronavirus 2: NEGATIVE

## 2020-06-01 ENCOUNTER — Inpatient Hospital Stay (HOSPITAL_COMMUNITY): Payer: Managed Care, Other (non HMO)

## 2020-06-02 ENCOUNTER — Encounter (HOSPITAL_COMMUNITY): Payer: Self-pay | Admitting: Obstetrics & Gynecology

## 2020-06-02 ENCOUNTER — Encounter (HOSPITAL_COMMUNITY): Payer: Self-pay | Admitting: Anesthesiology

## 2020-06-02 ENCOUNTER — Inpatient Hospital Stay (HOSPITAL_COMMUNITY)
Admission: AD | Admit: 2020-06-02 | Discharge: 2020-06-04 | DRG: 807 | Disposition: A | Discharge status: Home / Self Care | Payer: Managed Care, Other (non HMO) | Attending: Obstetrics & Gynecology | Admitting: Obstetrics & Gynecology

## 2020-06-02 ENCOUNTER — Inpatient Hospital Stay (HOSPITAL_COMMUNITY): Payer: Managed Care, Other (non HMO) | Admitting: Anesthesiology

## 2020-06-02 ENCOUNTER — Other Ambulatory Visit: Payer: Self-pay

## 2020-06-02 ENCOUNTER — Inpatient Hospital Stay (HOSPITAL_COMMUNITY): Payer: Managed Care, Other (non HMO)

## 2020-06-02 DIAGNOSIS — L409 Psoriasis, unspecified: Secondary | ICD-10-CM | POA: Diagnosis present

## 2020-06-02 DIAGNOSIS — O26893 Other specified pregnancy related conditions, third trimester: Secondary | ICD-10-CM | POA: Diagnosis present

## 2020-06-02 DIAGNOSIS — Z3A40 40 weeks gestation of pregnancy: Secondary | ICD-10-CM | POA: Diagnosis not present

## 2020-06-02 DIAGNOSIS — L4050 Arthropathic psoriasis, unspecified: Secondary | ICD-10-CM | POA: Diagnosis present

## 2020-06-02 DIAGNOSIS — Z3490 Encounter for supervision of normal pregnancy, unspecified, unspecified trimester: Secondary | ICD-10-CM

## 2020-06-02 DIAGNOSIS — Z20822 Contact with and (suspected) exposure to covid-19: Secondary | ICD-10-CM | POA: Diagnosis present

## 2020-06-02 LAB — CBC
HCT: 37.9 % (ref 36.0–46.0)
Hemoglobin: 12.6 g/dL (ref 12.0–15.0)
MCH: 29.6 pg (ref 26.0–34.0)
MCHC: 33.2 g/dL (ref 30.0–36.0)
MCV: 89 fL (ref 80.0–100.0)
Platelets: 271 10*3/uL (ref 150–400)
RBC: 4.26 MIL/uL (ref 3.87–5.11)
RDW: 13.2 % (ref 11.5–15.5)
WBC: 11.5 10*3/uL — ABNORMAL HIGH (ref 4.0–10.5)
nRBC: 0 % (ref 0.0–0.2)

## 2020-06-02 LAB — RPR: RPR Ser Ql: NONREACTIVE

## 2020-06-02 LAB — ABO/RH: ABO/RH(D): O POS

## 2020-06-02 LAB — TYPE AND SCREEN
ABO/RH(D): O POS
Antibody Screen: NEGATIVE

## 2020-06-02 MED ORDER — ONDANSETRON HCL 4 MG/2ML IJ SOLN: 4.0000 mg | INTRAMUSCULAR | Status: DC | PRN

## 2020-06-02 MED ORDER — COCONUT OIL OIL: 1.0000 "application " | TOPICAL_OIL | Status: DC | PRN

## 2020-06-02 MED ORDER — LACTATED RINGERS IV SOLN
500.0000 mL | Freq: Once | INTRAVENOUS | Status: AC
  Administered 2020-06-02: 500 mL via INTRAVENOUS

## 2020-06-02 MED ORDER — DIBUCAINE (PERIANAL) 1 % EX OINT: 1.0000 "application " | TOPICAL_OINTMENT | CUTANEOUS | Status: DC | PRN

## 2020-06-02 MED ORDER — FENTANYL-BUPIVACAINE-NACL 0.5-0.125-0.9 MG/250ML-% EP SOLN
12.0000 mL/h | EPIDURAL | Status: DC | PRN
  Filled 2020-06-02: qty 250

## 2020-06-02 MED ORDER — OXYCODONE-ACETAMINOPHEN 5-325 MG PO TABS: 1.0000 | ORAL_TABLET | ORAL | Status: DC | PRN

## 2020-06-02 MED ORDER — SENNOSIDES-DOCUSATE SODIUM 8.6-50 MG PO TABS
2.0000 | ORAL_TABLET | ORAL | Status: DC
  Administered 2020-06-02 – 2020-06-04 (×2): 2 via ORAL
  Filled 2020-06-02 (×2): qty 2

## 2020-06-02 MED ORDER — FENTANYL-BUPIVACAINE-NACL 0.5-0.125-0.9 MG/250ML-% EP SOLN
12.0000 mL/h | EPIDURAL | Status: DC | PRN
Start: 2020-06-02 — End: 2020-06-02

## 2020-06-02 MED ORDER — ZOLPIDEM TARTRATE 5 MG PO TABS: 5.0000 mg | ORAL_TABLET | Freq: Every evening | ORAL | Status: DC | PRN

## 2020-06-02 MED ORDER — FENTANYL CITRATE (PF) 100 MCG/2ML IJ SOLN: 50.0000 ug | INTRAMUSCULAR | Status: DC | PRN

## 2020-06-02 MED ORDER — BENZOCAINE-MENTHOL 20-0.5 % EX AERO
1.0000 "application " | INHALATION_SPRAY | CUTANEOUS | Status: DC | PRN
  Administered 2020-06-02: 1 via TOPICAL
  Filled 2020-06-02: qty 56

## 2020-06-02 MED ORDER — OXYTOCIN-SODIUM CHLORIDE 30-0.9 UT/500ML-% IV SOLN: 2.5000 [IU]/h | INTRAVENOUS | Status: DC

## 2020-06-02 MED ORDER — TERBUTALINE SULFATE 1 MG/ML IJ SOLN
0.2500 mg | Freq: Once | INTRAMUSCULAR | Status: DC | PRN
  Filled 2020-06-02: qty 1

## 2020-06-02 MED ORDER — DIPHENHYDRAMINE HCL 25 MG PO CAPS: 25.0000 mg | ORAL_CAPSULE | Freq: Four times a day (QID) | ORAL | Status: DC | PRN

## 2020-06-02 MED ORDER — TERBUTALINE SULFATE 1 MG/ML IJ SOLN
0.2500 mg | Freq: Once | INTRAMUSCULAR | Status: AC | PRN
  Administered 2020-06-02: 0.25 mg via SUBCUTANEOUS

## 2020-06-02 MED ORDER — PRENATAL MULTIVITAMIN CH
1.0000 | ORAL_TABLET | Freq: Every day | ORAL | Status: DC
  Administered 2020-06-03 – 2020-06-04 (×2): 1 via ORAL
  Filled 2020-06-02 (×2): qty 1

## 2020-06-02 MED ORDER — LIDOCAINE HCL (PF) 1 % IJ SOLN
INTRAMUSCULAR | Status: DC | PRN
  Administered 2020-06-02: 6 mL via EPIDURAL

## 2020-06-02 MED ORDER — ACETAMINOPHEN 325 MG PO TABS: 650.0000 mg | ORAL_TABLET | ORAL | Status: DC | PRN

## 2020-06-02 MED ORDER — MISOPROSTOL 25 MCG QUARTER TABLET
25.0000 ug | ORAL_TABLET | ORAL | Status: DC | PRN
  Filled 2020-06-02: qty 1

## 2020-06-02 MED ORDER — PHENYLEPHRINE 40 MCG/ML (10ML) SYRINGE FOR IV PUSH (FOR BLOOD PRESSURE SUPPORT)
80.0000 ug | PREFILLED_SYRINGE | INTRAVENOUS | Status: AC | PRN
  Administered 2020-06-02 (×3): 80 ug via INTRAVENOUS

## 2020-06-02 MED ORDER — SIMETHICONE 80 MG PO CHEW: 80.0000 mg | CHEWABLE_TABLET | ORAL | Status: DC | PRN

## 2020-06-02 MED ORDER — ONDANSETRON HCL 4 MG/2ML IJ SOLN: 4.0000 mg | Freq: Four times a day (QID) | INTRAMUSCULAR | Status: DC | PRN

## 2020-06-02 MED ORDER — SOD CITRATE-CITRIC ACID 500-334 MG/5ML PO SOLN: 30.0000 mL | ORAL | Status: DC | PRN

## 2020-06-02 MED ORDER — LIDOCAINE HCL (PF) 1 % IJ SOLN: 30.0000 mL | INTRAMUSCULAR | Status: DC | PRN

## 2020-06-02 MED ORDER — LACTATED RINGERS IV SOLN: INTRAVENOUS | Status: DC

## 2020-06-02 MED ORDER — DIPHENHYDRAMINE HCL 50 MG/ML IJ SOLN: 12.5000 mg | INTRAMUSCULAR | Status: DC | PRN

## 2020-06-02 MED ORDER — ACETAMINOPHEN 325 MG PO TABS
650.0000 mg | ORAL_TABLET | ORAL | Status: DC | PRN
  Administered 2020-06-02: 650 mg via ORAL
  Filled 2020-06-02 (×2): qty 2

## 2020-06-02 MED ORDER — SODIUM CHLORIDE (PF) 0.9 % IJ SOLN
INTRAMUSCULAR | Status: DC | PRN
  Administered 2020-06-02: 12 mL/h via EPIDURAL

## 2020-06-02 MED ORDER — OXYCODONE-ACETAMINOPHEN 5-325 MG PO TABS: 2.0000 | ORAL_TABLET | ORAL | Status: DC | PRN

## 2020-06-02 MED ORDER — OXYTOCIN-SODIUM CHLORIDE 30-0.9 UT/500ML-% IV SOLN
1.0000 m[IU]/min | INTRAVENOUS | Status: DC
  Administered 2020-06-02: 2 m[IU]/min via INTRAVENOUS
  Filled 2020-06-02: qty 500

## 2020-06-02 MED ORDER — ONDANSETRON HCL 4 MG PO TABS: 4.0000 mg | ORAL_TABLET | ORAL | Status: DC | PRN

## 2020-06-02 MED ORDER — WITCH HAZEL-GLYCERIN EX PADS: 1.0000 "application " | MEDICATED_PAD | CUTANEOUS | Status: DC | PRN

## 2020-06-02 MED ORDER — EPHEDRINE 5 MG/ML INJ
10.0000 mg | INTRAVENOUS | Status: DC | PRN
  Administered 2020-06-02: 10 mg via INTRAVENOUS

## 2020-06-02 MED ORDER — OXYTOCIN BOLUS FROM INFUSION: 333.0000 mL | Freq: Once | INTRAVENOUS | Status: DC

## 2020-06-02 MED ORDER — EPHEDRINE 5 MG/ML INJ
10.0000 mg | INTRAVENOUS | Status: DC | PRN
  Filled 2020-06-02: qty 10

## 2020-06-02 MED ORDER — PHENYLEPHRINE 40 MCG/ML (10ML) SYRINGE FOR IV PUSH (FOR BLOOD PRESSURE SUPPORT)
80.0000 ug | PREFILLED_SYRINGE | INTRAVENOUS | Status: DC | PRN
  Filled 2020-06-02: qty 10

## 2020-06-02 MED ORDER — TETANUS-DIPHTH-ACELL PERTUSSIS 5-2.5-18.5 LF-MCG/0.5 IM SUSP: 0.5000 mL | Freq: Once | INTRAMUSCULAR | Status: DC

## 2020-06-02 MED ORDER — IBUPROFEN 600 MG PO TABS
600.0000 mg | ORAL_TABLET | Freq: Four times a day (QID) | ORAL | Status: DC
  Administered 2020-06-02 – 2020-06-04 (×7): 600 mg via ORAL
  Filled 2020-06-02 (×7): qty 1

## 2020-06-02 MED ORDER — LACTATED RINGERS IV SOLN: 500.0000 mL | INTRAVENOUS | Status: DC | PRN

## 2020-06-02 MED ORDER — DIPHENHYDRAMINE HCL 50 MG/ML IJ SOLN
12.5000 mg | INTRAMUSCULAR | Status: DC | PRN
Start: 2020-06-02 — End: 2020-06-02

## 2020-06-02 NOTE — Anesthesia Postprocedure Evaluation (Signed)
Anesthesia Post Note  Patient: Lindsay Huynh  Procedure(s) Performed: AN AD HOC LABOR EPIDURAL     Patient location during evaluation: Mother Baby Anesthesia Type: Epidural Level of consciousness: awake and alert Pain management: pain level controlled Vital Signs Assessment: post-procedure vital signs reviewed and stable Respiratory status: spontaneous breathing, nonlabored ventilation and respiratory function stable Cardiovascular status: stable Postop Assessment: no headache, no backache and epidural receding Anesthetic complications: no   No complications documented.  Last Vitals:  Vitals:   06/02/20 1646 06/02/20 1701  BP: (!) 103/47 (!) 101/53  Pulse: 87 80  Resp: 18 18  Temp:    SpO2:      Last Pain:  Vitals:   06/02/20 1701  TempSrc:   PainSc: 0-No pain   Pain Goal:                   Addam Goeller

## 2020-06-02 NOTE — H&P (Signed)
Lindsay Huynh is a 29 y.o. female G1 at [redacted]w[redacted]d presenting for induction of labor.  Pitocin was started at 5 am.  Patient reports feeling no CTX.  No VB or LOF.  Active FM.  Antepartum course complicated by psoriasis and psoriatic arthritis; well controlled this pregnancy with topical clobetasol.  GBS negative.    OB History    Gravida  1   Para      Term      Preterm      AB      Living        SAB      TAB      Ectopic      Multiple      Live Births             Past Medical History:  Diagnosis Date  . Family history of adverse reaction to anesthesia   . Psoriasis    Past Surgical History:  Procedure Laterality Date  . NO PAST SURGERIES     Family History: family history is not on file. Social History:  reports that she has never smoked. She has never used smokeless tobacco. She reports current alcohol use. She reports that she does not use drugs.     Maternal Diabetes: No Genetic Screening: Normal Maternal Ultrasounds/Referrals: Normal Fetal Ultrasounds or other Referrals:  None Maternal Substance Abuse:  No Significant Maternal Medications:  None Significant Maternal Lab Results:  Group B Strep negative Other Comments:  None  Review of Systems Maternal Medical History:  Fetal activity: Perceived fetal activity is normal.   Last perceived fetal movement was within the past hour.    Prenatal complications: no prenatal complications Prenatal Complications - Diabetes: none.    Dilation: 4 Effacement (%): 80 Station: -2 Exam by:: Dr. Langston Masker Blood pressure 122/67, pulse 64, temperature 98 F (36.7 C), temperature source Oral, resp. rate 18, height 5\' 7"  (1.702 m), weight 86.5 kg. Maternal Exam:  Uterine Assessment: Contraction strength is mild.  Contraction frequency is rare.   Abdomen: Patient reports no abdominal tenderness. Fundal height is c/w dates.   Estimated fetal weight is 8#.   Fetal presentation: vertex  Introitus: Normal vulva.  Amniotic fluid character: clear.  Pelvis: adequate for delivery.   Cervix: Cervix evaluated by digital exam.     Fetal Exam Fetal Monitor Review: Baseline rate: 140.  Variability: moderate (6-25 bpm).   Pattern: no decelerations and accelerations present.    Fetal State Assessment: Category I - tracings are normal.     Physical Exam Constitutional:      Appearance: Normal appearance.  HENT:     Head: Normocephalic and atraumatic.  Pulmonary:     Effort: Pulmonary effort is normal.  Abdominal:     Palpations: Abdomen is soft.  Genitourinary:    General: Normal vulva.  Musculoskeletal:     Cervical back: Normal range of motion and neck supple.  Skin:    General: Skin is warm and dry.  Neurological:     Mental Status: She is alert.  Psychiatric:        Mood and Affect: Mood normal.        Behavior: Behavior normal.     Prenatal labs: ABO, Rh: --/--/O POS Performed at [facility], [address] [place], [place]  [phone]) Antibody: NEG (07/08 0020) Rubella: Immune (01/13 0000) RPR: Nonreactive (01/13 0000)  HBsAg: Negative (01/13 0000)  HIV:   NR GBS: Negative/-- (06/09 0000)   Assessment/Plan:  28yo G1 at [redacted]w[redacted]d for IOL -Continue pitocin -S/P AROM for clear fluid -CLEA if desired -Anticipate NSVD   Mitchel Honour 06/02/2020, 8:32 AM

## 2020-06-02 NOTE — Progress Notes (Signed)
     Faculty [facility] OB/GYN Attending Note  Called to emergently evaluate patient with prolonged FHR deceleration to 80s for over 6 minutes. This occurred after epidural placement. Has received two doses of phenylephrine and one dose of terbutaline. On my arrival, FSE was being placed by Dr. Crissie Reese and other intrauterine neonatal resuscitative maneuvers were ongoing.  FHR was noted to be going back up to baseline of 120s.  Patient and her husband were calm.  Cervical exam revealed the patient to be fully dilated/+1-+2 station. Verbally consented patient for vacuum delivery vs cesarean delivery if decelerations continue. Also advised her to start pushing now.  Dr. Langston Masker soon arrived, I gave report to her and handed over the care of the patient.   Jaynie Collins, MD, FACOG Obstetrician & Gynecologist, [facility] [employer], [facility]

## 2020-06-02 NOTE — Anesthesia Procedure Notes (Signed)
Epidural Patient location during procedure: OB Start time: 06/02/2020 10:25 AM End time: 06/02/2020 10:30 AM  Staffing Anesthesiologist: Bethena Midget, MD  Preanesthetic Checklist Completed: patient identified, IV checked, site marked, risks and benefits discussed, surgical consent, monitors and equipment checked, pre-op evaluation and timeout performed  Epidural Patient position: sitting Prep: DuraPrep and site prepped and draped Patient monitoring: continuous pulse ox and blood pressure Approach: midline Location: L3-L4 Injection technique: LOR air  Needle:  Needle type: Tuohy  Needle gauge: 17 G Needle length: 9 cm and 9 Needle insertion depth: 5 cm cm Catheter type: closed end flexible Catheter size: 19 Gauge Catheter at skin depth: 10 cm Test dose: negative  Assessment Events: blood not aspirated, injection not painful, no injection resistance, no paresthesia and negative IV test

## 2020-06-02 NOTE — Progress Notes (Signed)
Received call at 1050 from RN that following CLEA placement, deceleration x 3 minutes was in progress.  I immediately left my office and arrived to LDR at 1059.  Dr. Lynetta Mare and Crissie Reese were present on my arrival.  Pitocin was off, terbulatine and phenylephrine had been given in addition to repositioning.  On my arrival, FHT were in 74s and patient was 10/c/+1 and had adequate anesthesia.  I pushed with the patient for a few contraction and given reassuring FHT with return to baseline of 155 bpm and variability restored, I will allow an hour to labor down and recover.    Mitchel Honour, DO

## 2020-06-02 NOTE — Anesthesia Preprocedure Evaluation (Signed)
Anesthesia Evaluation  Patient identified by MRN, date of birth, ID band Patient awake    Reviewed: Allergy & Precautions, H&P , NPO status , Patient's Chart, lab work & pertinent test results, reviewed documented beta blocker date and time   Airway Mallampati: II  TM Distance: >3 FB Neck ROM: full    Dental no notable dental hx. (+) Teeth Intact, Dental Advisory Given   Pulmonary neg pulmonary ROS,    Pulmonary exam normal breath sounds clear to auscultation       Cardiovascular negative cardio ROS Normal cardiovascular exam Rhythm:regular Rate:Normal     Neuro/Psych negative neurological ROS  negative psych ROS   GI/Hepatic negative GI ROS, Neg liver ROS,   Endo/Other  negative endocrine ROS  Renal/GU negative Renal ROS  negative genitourinary   Musculoskeletal   Abdominal   Peds  Hematology negative hematology ROS (+)   Anesthesia Other Findings   Reproductive/Obstetrics (+) Pregnancy                             Anesthesia Physical Anesthesia Plan  ASA: II  Anesthesia Plan: Epidural   Post-op Pain Management:    Induction:   PONV Risk Score and Plan:   Airway Management Planned:   Additional Equipment:   Intra-op Plan:   Post-operative Plan:   Informed Consent: I have reviewed the patients History and Physical, chart, labs and discussed the procedure including the risks, benefits and alternatives for the proposed anesthesia with the patient or authorized representative who has indicated his/her understanding and acceptance.     Dental Advisory Given  Plan Discussed with: Anesthesiologist  Anesthesia Plan Comments: (Labs checked- platelets confirmed with RN in room. Fetal heart tracing, per RN, reported to be stable enough for sitting procedure. Discussed epidural, and patient consents to the procedure:  included risk of possible headache,backache, failed block,  allergic reaction, and nerve injury. This patient was asked if she had any questions or concerns before the procedure started.)        Anesthesia Quick Evaluation  

## 2020-06-03 LAB — CBC
HCT: 33.5 % — ABNORMAL LOW (ref 36.0–46.0)
Hemoglobin: 11.1 g/dL — ABNORMAL LOW (ref 12.0–15.0)
MCH: 29.3 pg (ref 26.0–34.0)
MCHC: 33.1 g/dL (ref 30.0–36.0)
MCV: 88.4 fL (ref 80.0–100.0)
Platelets: 215 10*3/uL (ref 150–400)
RBC: 3.79 MIL/uL — ABNORMAL LOW (ref 3.87–5.11)
RDW: 13.4 % (ref 11.5–15.5)
WBC: 14.8 10*3/uL — ABNORMAL HIGH (ref 4.0–10.5)
nRBC: 0 % (ref 0.0–0.2)

## 2020-06-03 NOTE — Lactation Note (Addendum)
This note was copied from a baby's chart. Lactation Consultation Note  Patient Name: Lindsay Huynh [id number] Date: 06/03/2020  Reason for consult: Initial assessment;Primapara;1st time breastfeeding;Term  Patient instructed on how to do hand expression and was able to express drops of colostrom.  Mom instructed to pump to increase stimulation.  Infant had a latch score of 6 sleepy at the breast with latch on breast and nipple shield.  Infant sleepy after a bath. Brief intermittent suckling noted. Mom brought her own manual pump -- Lansinoh, Medela,  And Haakaa.   LC and patient discussed follow up tonight. Breastfeeding and basics reviewed.  Accompanied by Walker Shadow.    Maternal Data Has patient been taught Hand Expression?: Yes Does the patient have breastfeeding experience prior to this delivery?: No  Feeding Feeding Type: Breast Milk (colostrom expressed/infant lactched sleepy on nipple &shield)  LATCH Score Latch: Repeated attempts needed to sustain latch, nipple held in mouth throughout feeding, stimulation needed to elicit sucking reflex.  Audible Swallowing: None  Type of Nipple: Everted at rest and after stimulation  Comfort (Breast/Nipple): Soft / non-tender  Hold (Positioning): Assistance needed to correctly position infant at breast and maintain latch.  LATCH Score: 6  Interventions Interventions: Breast feeding basics reviewed;Assisted with latch;Skin to skin;Hand express;Adjust position;Position options  Lactation Tools Discussed/Used Pump Review:  (Mother encouraged to pump for stimulation. )   Consult Status Consult Status: Follow-up    Otelia Hettinger  Nicholson-Springer 06/03/2020, 11:10 AM

## 2020-06-03 NOTE — Discharge Summary (Signed)
Postpartum Discharge Summary  Date of Service updated 06/03/20     Patient Name: Lindsay Huynh DOB: 11-25-1991 MRN: [id number]  Date of admission: 06/02/2020 Delivery date:06/02/2020  Delivering provider: Linda Hedges  Date of discharge: 06/03/2020  Admitting diagnosis: Pregnancy [Z34.90] Intrauterine pregnancy: [redacted]w[redacted]d    Secondary diagnosis:  Active Problems:   Pregnancy  Additional problems: none    Discharge diagnosis: Term Pregnancy Delivered                                              Post partum procedures:none Augmentation: AROM and Pitocin Complications: None  Hospital course: Onset of Labor With Vaginal Delivery      29y.o. yo G1P1001 at 473w4das admitted for IOL. on 06/02/2020. Patient had an uncomplicated labor course as follows:  Membrane Rupture Time/Date: 7:32 AM ,06/02/2020   Delivery Method:Vaginal, Vacuum (Extractor)  Episiotomy: None  Lacerations:  2nd degree  Patient had an uncomplicated postpartum course.  She is ambulating, tolerating a regular diet, passing flatus, and urinating well. Patient is discharged home in stable condition on 06/03/20.  Newborn Data: Birth date:06/02/2020  Birth time:3:37 PM  Gender:Female  Living status:Living  Apgars:8 ,9  Weight:3550 g   Magnesium Sulfate received: No BMZ received: No Rhophylac:N/A MMR:N/A T-DaP:Given prenatally Flu: N/A Transfusion:No  Physical exam  Vitals:   06/02/20 2305 06/03/20 0335 06/03/20 0500 06/03/20 0803  BP: 119/60 (!) 107/53 106/60 (!) 105/58  Pulse: 60 67 60 62  Resp: _0 Temp: 98.1 F (36.7 C) 97.8 F (36.6 C) 97.6 F (36.4 C) 97.7 F (36.5 C)  TempSrc: Oral Oral Oral   SpO2: 100% 99% 99% 100%  Weight:      Height:       General: alert Lochia: appropriate Uterine Fundus: firm Incision: Healing well with no significant drainage DVT Evaluation: No evidence of DVT seen on physical exam. Labs: Lab Results  Component Value Date   WBC 14.8 (H) 06/03/2020   HGB  11.1 (L) 06/03/2020   HCT 33.5 (L) 06/03/2020   MCV 88.4 06/03/2020   PLT 215 06/03/2020   CMP Latest Ref Rng & Units 01/05/2019  Glucose 65 - 99 mg/dL -  BUN 6 - 20 mg/dL -  Creatinine 0.57 - 1.00 mg/dL -  Sodium 134 - 144 mmol/L -  Potassium 3.5 - 5.2 mmol/L -  Chloride 96 - 106 mmol/L -  CO2 20 - 29 mmol/L -  Calcium 8.7 - 10.2 mg/dL -  Total Protein 6.1 - 8.1 g/dL 6.9  Total Bilirubin 0.0 - 1.2 mg/dL -  Alkaline Phos 39 - 117 IU/L -  AST 0 - 40 IU/L -  ALT 0 - 32 IU/L -   Edinburgh Score: Edinburgh Postnatal Depression Scale Screening Tool 06/03/2020  I have been able to laugh and see the funny side of things. 0  I have looked forward with enjoyment to things. 0  I have blamed myself unnecessarily when things went wrong. 1  I have been anxious or worried for no good reason. 2  I have felt scared or panicky for no good reason. 2  Things have been getting on top of me. 1  I have been so unhappy that I have had difficulty sleeping. 1  I have felt sad or miserable. 1  I have been so unhappy that I have  been crying. 0  The thought of harming myself has occurred to me. 0  Edinburgh Postnatal Depression Scale Total 8      After visit meds:  Allergies as of 06/03/2020   No Known Allergies     Medication List    STOP taking these medications   meloxicam 15 MG tablet Commonly known as: MOBIC   Tri-Previfem 0.18/0.215/0.25 MG-35 MCG tablet Generic drug: Norgestimate-Ethinyl Estradiol Triphasic      Declines circ    Discharge home in stable condition Infant Feeding: Breast Infant Disposition:home with mother Discharge instruction: per After Visit Summary and Postpartum booklet. Activity: Advance as tolerated. Pelvic rest for 6 weeks.  Diet: routine diet Anticipated Birth Control: Unsure Postpartum Appointment:6 weeks Additional Postpartum F/U: n/a Future Appointments:No future appointments. Follow up Visit:      06/03/2020 Tyson Dense, MD

## 2020-06-04 NOTE — Progress Notes (Signed)
Patient seen and examined.  No changes. Please see DCS

## 2020-06-04 NOTE — Lactation Note (Signed)
This note was copied from a baby's chart. Lactation Consultation Note  Patient Name: Boy Candise Crabtree [id number] Date: 06/04/2020 Reason for consult: Follow-up assessment   Mother paged for [facility] assistance with latching infant. Infant alert and ready for feeding . Mother up in chair with pillows in cross cradle position.   Several attempts to latch infant on the base breast. Infant tugged a few tugs and pulled back. Mother reports that she has been having pain scale of #3 with latching.  Mother was fit with a #24 NS.    Infant latched with a good deep latch with wide open gape. Mother reports that latch feels much better. She denies pain .  Observed frequent suckles and swallows. With a good rhythmic suckling pattern.  Mother taught to do breast compression.   Infant was given 15 ml of donor milk at the breast with a curved tip syringe. Tolerated well. Several attempts to burp infant. Infant had a wet diaper. Parents were given supplemental guidelines. Father taught to finger feed infant with curved tip syringe. Infant was given another 5 ml .  Parents were given a foley cup for cup feeding.  Discussed cup feeding and paced bottle feeding using a wide base bottle nipple.   Staff nurse to sat up DEBP for mother to pump after feeding for 15-20 mins every 2-3 hours.  Encouraged parents to rouse infant before 3 hours if infant very sleepy for feedings. Mother to pump every 2-3 hours . Supplement infant with donor milk using guidelines.    Maternal Data    Feeding Feeding Type: Donor Breast Milk  LATCH Score Latch: Grasps breast easily, tongue down, lips flanged, rhythmical sucking.  Audible Swallowing: A few with stimulation  Type of Nipple: Everted at rest and after stimulation  Comfort (Breast/Nipple): Filling, red/small blisters or bruises, mild/mod discomfort  Hold (Positioning): Assistance needed to correctly position infant at breast and maintain latch.  LATCH Score:  7  Interventions Interventions: Assisted with latch;Skin to skin;Hand express;Breast compression;Adjust position;Support pillows;Position options  Lactation Tools Discussed/Used Tools: Nipple Shields Nipple shield size: 24   Consult Status Consult Status: Follow-up Date: 06/05/20 Follow-up type: In-patient    Stevan Born [facility] 06/04/2020, 12:07 PM

## 2020-09-06 ENCOUNTER — Other Ambulatory Visit: Payer: Managed Care, Other (non HMO)

## 2020-09-06 DIAGNOSIS — Z20822 Contact with and (suspected) exposure to covid-19: Secondary | ICD-10-CM

## 2020-09-07 LAB — SARS-COV-2, NAA 2 DAY TAT

## 2020-09-07 LAB — NOVEL CORONAVIRUS, NAA: SARS-CoV-2, NAA: NOT DETECTED

## 2021-08-07 LAB — OB RESULTS CONSOLE GC/CHLAMYDIA
Chlamydia: NEGATIVE
Gonorrhea: NEGATIVE

## 2021-08-07 LAB — OB RESULTS CONSOLE RPR: RPR: NONREACTIVE

## 2021-08-07 LAB — OB RESULTS CONSOLE RUBELLA ANTIBODY, IGM: Rubella: IMMUNE

## 2021-08-07 LAB — OB RESULTS CONSOLE ANTIBODY SCREEN: Antibody Screen: NEGATIVE

## 2021-08-07 LAB — OB RESULTS CONSOLE HEPATITIS B SURFACE ANTIGEN: Hepatitis B Surface Ag: NEGATIVE

## 2021-08-07 LAB — OB RESULTS CONSOLE ABO/RH: RH Type: POSITIVE

## 2021-08-07 LAB — HEPATITIS C ANTIBODY: HCV Ab: NEGATIVE

## 2021-08-07 LAB — OB RESULTS CONSOLE HIV ANTIBODY (ROUTINE TESTING): HIV: NONREACTIVE

## 2021-10-23 ENCOUNTER — Inpatient Hospital Stay (HOSPITAL_COMMUNITY)
Admission: AD | Admit: 2021-10-23 | Discharge: 2021-10-23 | Disposition: A | Discharge status: Home / Self Care | Payer: Managed Care, Other (non HMO) | Attending: Obstetrics and Gynecology | Admitting: Obstetrics and Gynecology

## 2021-10-23 ENCOUNTER — Encounter (HOSPITAL_COMMUNITY): Payer: Self-pay | Admitting: Obstetrics and Gynecology

## 2021-10-23 ENCOUNTER — Other Ambulatory Visit: Payer: Self-pay

## 2021-10-23 DIAGNOSIS — Z3A20 20 weeks gestation of pregnancy: Secondary | ICD-10-CM | POA: Insufficient documentation

## 2021-10-23 DIAGNOSIS — O26892 Other specified pregnancy related conditions, second trimester: Secondary | ICD-10-CM | POA: Diagnosis not present

## 2021-10-23 DIAGNOSIS — O219 Vomiting of pregnancy, unspecified: Secondary | ICD-10-CM | POA: Insufficient documentation

## 2021-10-23 DIAGNOSIS — O99891 Other specified diseases and conditions complicating pregnancy: Secondary | ICD-10-CM

## 2021-10-23 DIAGNOSIS — R519 Headache, unspecified: Secondary | ICD-10-CM | POA: Insufficient documentation

## 2021-10-23 HISTORY — DX: Arthropathic psoriasis, unspecified: L40.50

## 2021-10-23 LAB — URINALYSIS, ROUTINE W REFLEX MICROSCOPIC
Bilirubin Urine: NEGATIVE
Glucose, UA: NEGATIVE mg/dL
Hgb urine dipstick: NEGATIVE
Ketones, ur: NEGATIVE mg/dL
Nitrite: NEGATIVE
Protein, ur: NEGATIVE mg/dL
Specific Gravity, Urine: 1.004 — ABNORMAL LOW (ref 1.005–1.030)
pH: 6 (ref 5.0–8.0)

## 2021-10-23 MED ORDER — ACETAMINOPHEN 500 MG PO TABS
1000.0000 mg | ORAL_TABLET | Freq: Once | ORAL | Status: AC
  Administered 2021-10-23: 18:00:00 1000 mg via ORAL
  Filled 2021-10-23: qty 2

## 2021-10-23 MED ORDER — ONDANSETRON HCL 4 MG PO TABS
4.0000 mg | ORAL_TABLET | Freq: Four times a day (QID) | ORAL | 0 refills | Status: DC
Start: 2021-10-23 — End: 2022-03-14

## 2021-10-23 MED ORDER — ONDANSETRON 4 MG PO TBDP
4.0000 mg | ORAL_TABLET | Freq: Once | ORAL | Status: AC
  Administered 2021-10-23: 18:00:00 4 mg via ORAL
  Filled 2021-10-23: qty 1

## 2021-10-23 NOTE — MAU Provider Note (Signed)
History     CSN: [id number]  Arrival date and time: 10/23/21 1626   None     Chief Complaint  Patient presents with   Headache   Emesis   Nausea   HPI Lindsay Huynh is a 30 y.o. G2P1001 at [redacted]w[redacted]d who presents to MAU for nausea, vomiting and headache. Patient reports she had a headache for most of the day yesterday and felt nauseous. She had a cold [place] sandwich on Saturday morning that was leftove from Thursday. She had another Thanksgiving dinner with family yesterday. Around 6:30pm she vomited 3x and 1x this morning. She denies diarrhea, abdominal pain, fever. No one else is sick. No vaginal bleeding or leaking fluid. Has not taken anything for nausea or headache. Currently rates headache 5/10.   OB History     Gravida  2   Para  1   Term  1   Preterm      AB      Living  1      SAB      IAB      Ectopic      Multiple  0   Live Births  1           Past Medical History:  Diagnosis Date   Family history of adverse reaction to anesthesia    Psoriasis    Psoriasis    Psoriatic arthritis (HCC)    Stomach ulcer 2012    Past Surgical History:  Procedure Laterality Date   NO PAST SURGERIES      Family History  Problem Relation Age of Onset   Hyperthyroidism Mother    Healthy Father     Social History   Tobacco Use   Smoking status: Never   Smokeless tobacco: Never  Vaping Use   Vaping Use: Never used  Substance Use Topics   Alcohol use: Not Currently    Comment: socially   Drug use: Never    Allergies: No Known Allergies  Medications Prior to Admission  Medication Sig Dispense Refill Last Dose   Prenatal Vit-Fe Fumarate-FA (MULTIVITAMIN-PRENATAL) 27-0.8 MG TABS tablet Take 1 tablet by mouth daily at 12 noon.   10/23/2021    Review of Systems  Constitutional: Negative.   Respiratory: Negative.    Cardiovascular: Negative.   Gastrointestinal:  Positive for nausea and vomiting. Negative for abdominal pain, constipation and  diarrhea.  Genitourinary: Negative.   Musculoskeletal: Negative.   Neurological:  Positive for headaches.   Physical Exam   Blood pressure 126/61, pulse 80, temperature 98 F (36.7 C), temperature source Oral, resp. rate 18, height 5\' 7"  (1.702 m), weight 79.7 kg, SpO2 100 %, unknown if currently breastfeeding.  Physical Exam Vitals and nursing note reviewed.  Constitutional:      General: She is not in acute distress. Cardiovascular:     Rate and Rhythm: Normal rate.  Pulmonary:     Effort: Pulmonary effort is normal.  Abdominal:     Palpations: Abdomen is soft.     Tenderness: There is no abdominal tenderness.  Skin:    General: Skin is warm and dry.  Neurological:     Mental Status: She is alert and oriented to person, place, and time.  Psychiatric:        Mood and Affect: Mood normal.        Speech: Speech normal.        Behavior: Behavior normal.   FHR: 147 bpm via doppler  MAU Course  Procedures  MDM  UA Zofran ODT Tylenol 1000mg  PO Nausea and headache improved  Assessment and Plan  [redacted] weeks gestation of pregnancy Nausea and vomiting in pregnancy  - Discharge home in stable condition - Rx for Zofran sent to pharmacy - Strict return precautions reviewed. Return to MAU as needed - Keep OB appointment as scheduled    10/23/2021, 6:41 PM

## 2021-10-23 NOTE — MAU Note (Signed)
Started with HA yesterday.  Went to another Thanksgiving celebration yesterday.  Felt kind of full/sick after eating .  About 5 hrs later started throwing up.  Had eaten [place] on [place], had a leftover [place] sandwich on Sat, did not heat the [place].  Was concerned it might be food poisoning or Listeria.  Pt denies any abd pain or diarrhea,no one else is sick.  Last time she vomited was 1130.

## 2021-10-23 NOTE — MAU Note (Signed)
Presents with c/o H/A and N/V.  Reports she thinks she may have food poisoning and make sure she doesn't  have Listeria.  States vomited once today & 3x last night after eating a [place] sandwich.  Denies abdominal pain. Denies VB or LOF.

## 2021-11-26 NOTE — L&D Delivery Note (Signed)
Delivery Note ? ?SVD viable female Apgars 9,9 over 2nd degree ML lac.  Nuchal x 1 reduced on perineum.  Placenta delivered spontaneously intact with 3VC.  Placenta with succenturate placenta identified.  MEU without retained POC ?Repair with 2-0 Chromic with good support and hemostasis noted.  R/V exam confirms..  ? ?Mother and baby to couplet care and are doing well. ? ?EBL 100cc ? ?Candice Camp, MD  ?

## 2022-02-14 LAB — OB RESULTS CONSOLE GBS: GBS: NEGATIVE

## 2022-02-28 ENCOUNTER — Telehealth (HOSPITAL_COMMUNITY): Payer: Self-pay | Admitting: *Deleted

## 2022-02-28 NOTE — Telephone Encounter (Signed)
Preadmission screen  

## 2022-03-01 ENCOUNTER — Encounter (HOSPITAL_COMMUNITY): Payer: Self-pay | Admitting: *Deleted

## 2022-03-01 ENCOUNTER — Telehealth (HOSPITAL_COMMUNITY): Payer: Self-pay | Admitting: *Deleted

## 2022-03-01 NOTE — Telephone Encounter (Signed)
Preadmission screen  

## 2022-03-12 ENCOUNTER — Telehealth (HOSPITAL_COMMUNITY): Payer: Self-pay | Admitting: *Deleted

## 2022-03-12 NOTE — Telephone Encounter (Signed)
Preadmission screen  

## 2022-03-13 ENCOUNTER — Encounter (HOSPITAL_COMMUNITY): Payer: Self-pay | Admitting: Anesthesiology

## 2022-03-13 ENCOUNTER — Inpatient Hospital Stay (HOSPITAL_COMMUNITY): Payer: Managed Care, Other (non HMO)

## 2022-03-13 ENCOUNTER — Encounter (HOSPITAL_COMMUNITY): Payer: Self-pay | Admitting: Obstetrics and Gynecology

## 2022-03-13 ENCOUNTER — Inpatient Hospital Stay (HOSPITAL_COMMUNITY)
Admission: AD | Admit: 2022-03-13 | Discharge: 2022-03-14 | DRG: 807 | Disposition: A | Discharge status: Home / Self Care | Payer: Managed Care, Other (non HMO) | Attending: Obstetrics and Gynecology | Admitting: Obstetrics and Gynecology

## 2022-03-13 ENCOUNTER — Other Ambulatory Visit: Payer: Self-pay

## 2022-03-13 DIAGNOSIS — O480 Post-term pregnancy: Principal | ICD-10-CM | POA: Diagnosis present

## 2022-03-13 DIAGNOSIS — Z3A40 40 weeks gestation of pregnancy: Secondary | ICD-10-CM | POA: Diagnosis not present

## 2022-03-13 DIAGNOSIS — Z3490 Encounter for supervision of normal pregnancy, unspecified, unspecified trimester: Principal | ICD-10-CM | POA: Diagnosis present

## 2022-03-13 LAB — TYPE AND SCREEN
ABO/RH(D): O POS
Antibody Screen: NEGATIVE

## 2022-03-13 LAB — CBC
HCT: 40.4 % (ref 36.0–46.0)
Hemoglobin: 14.3 g/dL (ref 12.0–15.0)
MCH: 32.1 pg (ref 26.0–34.0)
MCHC: 35.4 g/dL (ref 30.0–36.0)
MCV: 90.8 fL (ref 80.0–100.0)
Platelets: 231 10*3/uL (ref 150–400)
RBC: 4.45 MIL/uL (ref 3.87–5.11)
RDW: 12.5 % (ref 11.5–15.5)
WBC: 9.6 10*3/uL (ref 4.0–10.5)
nRBC: 0 % (ref 0.0–0.2)

## 2022-03-13 LAB — RPR: RPR Ser Ql: NONREACTIVE

## 2022-03-13 MED ORDER — LIDOCAINE HCL (PF) 1 % IJ SOLN
30.0000 mL | INTRAMUSCULAR | Status: AC | PRN
  Administered 2022-03-13: 30 mL via SUBCUTANEOUS
  Filled 2022-03-13: qty 30

## 2022-03-13 MED ORDER — WITCH HAZEL-GLYCERIN EX PADS: 1.0000 "application " | MEDICATED_PAD | CUTANEOUS | Status: DC | PRN

## 2022-03-13 MED ORDER — DIBUCAINE (PERIANAL) 1 % EX OINT: 1.0000 "application " | TOPICAL_OINTMENT | CUTANEOUS | Status: DC | PRN

## 2022-03-13 MED ORDER — ACETAMINOPHEN 325 MG PO TABS: 650.0000 mg | ORAL_TABLET | ORAL | Status: DC | PRN

## 2022-03-13 MED ORDER — PHENYLEPHRINE 80 MCG/ML (10ML) SYRINGE FOR IV PUSH (FOR BLOOD PRESSURE SUPPORT): 80.0000 ug | PREFILLED_SYRINGE | INTRAVENOUS | Status: DC | PRN

## 2022-03-13 MED ORDER — IBUPROFEN 600 MG PO TABS
600.0000 mg | ORAL_TABLET | Freq: Four times a day (QID) | ORAL | Status: DC
  Administered 2022-03-13 – 2022-03-14 (×5): 600 mg via ORAL
  Filled 2022-03-13 (×5): qty 1

## 2022-03-13 MED ORDER — OXYCODONE-ACETAMINOPHEN 5-325 MG PO TABS: 1.0000 | ORAL_TABLET | ORAL | Status: DC | PRN

## 2022-03-13 MED ORDER — FENTANYL-BUPIVACAINE-NACL 0.5-0.125-0.9 MG/250ML-% EP SOLN
12.0000 mL/h | EPIDURAL | Status: DC | PRN
  Filled 2022-03-13: qty 250

## 2022-03-13 MED ORDER — LACTATED RINGERS IV SOLN: 500.0000 mL | INTRAVENOUS | Status: DC | PRN

## 2022-03-13 MED ORDER — ONDANSETRON HCL 4 MG PO TABS: 4.0000 mg | ORAL_TABLET | ORAL | Status: DC | PRN

## 2022-03-13 MED ORDER — ONDANSETRON HCL 4 MG/2ML IJ SOLN: 4.0000 mg | INTRAMUSCULAR | Status: DC | PRN

## 2022-03-13 MED ORDER — OXYCODONE-ACETAMINOPHEN 5-325 MG PO TABS: 2.0000 | ORAL_TABLET | ORAL | Status: DC | PRN

## 2022-03-13 MED ORDER — SOD CITRATE-CITRIC ACID 500-334 MG/5ML PO SOLN: 30.0000 mL | ORAL | Status: DC | PRN

## 2022-03-13 MED ORDER — PRENATAL MULTIVITAMIN CH
1.0000 | ORAL_TABLET | Freq: Every day | ORAL | Status: DC
  Administered 2022-03-13 – 2022-03-14 (×2): 1 via ORAL
  Filled 2022-03-13 (×2): qty 1

## 2022-03-13 MED ORDER — OXYTOCIN-SODIUM CHLORIDE 30-0.9 UT/500ML-% IV SOLN: 1.0000 m[IU]/min | INTRAVENOUS | Status: DC

## 2022-03-13 MED ORDER — OXYTOCIN-SODIUM CHLORIDE 30-0.9 UT/500ML-% IV SOLN
2.5000 [IU]/h | INTRAVENOUS | Status: DC
  Administered 2022-03-13: 2.5 [IU]/h via INTRAVENOUS
  Filled 2022-03-13: qty 500

## 2022-03-13 MED ORDER — LACTATED RINGERS IV SOLN: 500.0000 mL | Freq: Once | INTRAVENOUS | Status: DC

## 2022-03-13 MED ORDER — SIMETHICONE 80 MG PO CHEW: 80.0000 mg | CHEWABLE_TABLET | ORAL | Status: DC | PRN

## 2022-03-13 MED ORDER — SENNOSIDES-DOCUSATE SODIUM 8.6-50 MG PO TABS
2.0000 | ORAL_TABLET | Freq: Every day | ORAL | Status: DC
  Administered 2022-03-14: 2 via ORAL
  Filled 2022-03-13: qty 2

## 2022-03-13 MED ORDER — COCONUT OIL OIL: 1.0000 "application " | TOPICAL_OIL | Status: DC | PRN

## 2022-03-13 MED ORDER — DIPHENHYDRAMINE HCL 25 MG PO CAPS: 25.0000 mg | ORAL_CAPSULE | Freq: Four times a day (QID) | ORAL | Status: DC | PRN

## 2022-03-13 MED ORDER — BUTORPHANOL TARTRATE 1 MG/ML IJ SOLN: 1.0000 mg | INTRAMUSCULAR | Status: DC | PRN

## 2022-03-13 MED ORDER — MEDROXYPROGESTERONE ACETATE 150 MG/ML IM SUSP: 150.0000 mg | INTRAMUSCULAR | Status: DC | PRN

## 2022-03-13 MED ORDER — EPHEDRINE 5 MG/ML INJ: 10.0000 mg | INTRAVENOUS | Status: DC | PRN

## 2022-03-13 MED ORDER — LACTATED RINGERS IV SOLN: INTRAVENOUS | Status: DC

## 2022-03-13 MED ORDER — DIPHENHYDRAMINE HCL 50 MG/ML IJ SOLN: 12.5000 mg | INTRAMUSCULAR | Status: DC | PRN

## 2022-03-13 MED ORDER — ONDANSETRON HCL 4 MG/2ML IJ SOLN: 4.0000 mg | Freq: Four times a day (QID) | INTRAMUSCULAR | Status: DC | PRN

## 2022-03-13 MED ORDER — ACETAMINOPHEN 325 MG PO TABS
650.0000 mg | ORAL_TABLET | ORAL | Status: DC | PRN
  Administered 2022-03-13: 650 mg via ORAL
  Filled 2022-03-13: qty 2

## 2022-03-13 MED ORDER — TERBUTALINE SULFATE 1 MG/ML IJ SOLN: 0.2500 mg | Freq: Once | INTRAMUSCULAR | Status: DC | PRN

## 2022-03-13 MED ORDER — BENZOCAINE-MENTHOL 20-0.5 % EX AERO
1.0000 "application " | INHALATION_SPRAY | CUTANEOUS | Status: DC | PRN
  Administered 2022-03-13: 1 via TOPICAL
  Filled 2022-03-13: qty 56

## 2022-03-13 MED ORDER — MEASLES, MUMPS & RUBELLA VAC IJ SOLR: 0.5000 mL | Freq: Once | INTRAMUSCULAR | Status: DC

## 2022-03-13 MED ORDER — ZOLPIDEM TARTRATE 5 MG PO TABS: 5.0000 mg | ORAL_TABLET | Freq: Every evening | ORAL | Status: DC | PRN

## 2022-03-13 MED ORDER — OXYTOCIN BOLUS FROM INFUSION
333.0000 mL | Freq: Once | INTRAVENOUS | Status: AC
  Administered 2022-03-13: 333 mL via INTRAVENOUS

## 2022-03-13 MED ORDER — TETANUS-DIPHTH-ACELL PERTUSSIS 5-2.5-18.5 LF-MCG/0.5 IM SUSY: 0.5000 mL | PREFILLED_SYRINGE | Freq: Once | INTRAMUSCULAR | Status: DC

## 2022-03-13 NOTE — H&P (Signed)
Lindsay Huynh is a 31 y.o. female presenting for IOL due to postdates and favorable cx.  Pregnancy uncomplicated.  GBS neg. ?OB History   ? ? Gravida  ?2  ? Para  ?1  ? Term  ?1  ? Preterm  ?   ? AB  ?   ? Living  ?1  ?  ? ? SAB  ?   ? IAB  ?   ? Ectopic  ?   ? Multiple  ?0  ? Live Births  ?1  ?   ?  ?  ? ?Past Medical History:  ?Diagnosis Date  ? Psoriasis   ? Psoriasis   ? Psoriatic arthritis (HCC)   ? Stomach ulcer 2012  ? ?Past Surgical History:  ?Procedure Laterality Date  ? NO PAST SURGERIES    ? ?Family History: family history includes Healthy in her father; Hyperthyroidism in her mother. ?Social History:  reports that she has never smoked. She has never used smokeless tobacco. She reports that she does not currently use alcohol. She reports that she does not use drugs. ? ? ?  ?Maternal Diabetes: No ?Genetic Screening: Normal ?Maternal Ultrasounds/Referrals: Normal ?Fetal Ultrasounds or other Referrals:  None ?Maternal Substance Abuse:  No ?Significant Maternal Medications:  None ?Significant Maternal Lab Results:  Group B Strep negative ?Other Comments:  None ? ?Review of Systems ?History ?Dilation: 4 ?Effacement (%): 90 ?Station: -2 ?Exam by:: Rana Snare, MD ?Blood pressure 136/64, pulse 76, temperature (!) 97.4 ?F (36.3 ?C), temperature source Oral, resp. rate 18, height 5\' 7"  (1.702 m), weight 89.5 kg, unknown if currently breastfeeding. ?Exam ?Physical Exam  ?Vitals and nursing note reviewed. Exam conducted with a chaperone present.  ?Constitutional:   ?   Appearance: Normal appearance.  ?HENT:  ?   Head: Normocephalic.  ?Eyes:  ?   Pupils: Pupils are equal, round, and reactive to light.  ?Cardiovascular:  ?   Rate and Rhythm: Normal rate and regular rhythm.  ?   Pulses: Normal pulses.  ?Abdominal:  ?   General: Abdomen is Gravid, nontender ?Neurological:  ?   Mental Status: She is alert.  ?Prenatal labs: ?ABO, Rh: --/--/PENDING (04/18 06-11-1997) ?Antibody: PENDING (04/18 0739) ?Rubella: Immune (09/12  0000) ?RPR: Nonreactive (09/12 0000)  ?HBsAg: Negative (09/12 0000)  ?HIV: Non-reactive (09/12 0000)  ?GBS: Negative/-- (03/22 0000)  ? ?Assessment/Plan: ?IUP at 40 weeks ?IOL ?Plan AROM possible pitocin. ?Anticipate SVD  ? ?12-30-1977 ?03/13/2022, 9:01 AM ? ? ? ? ?

## 2022-03-13 NOTE — Lactation Note (Signed)
This note was copied from a baby's chart. ?Lactation Consultation Note ? ?Patient Name: Lindsay Huynh ?Today's Date: 03/13/2022 ?Reason for consult: Initial assessment;Mother's request;Difficult latch;Primapara;1st time breastfeeding;Term;Breastfeeding assistance ?Age:31 years ? ?LC assisted getting a deeper latch in cross cradle prone. Infant recent feeding just prior to [facility] arrival.  ? ?Plan 1. Mom to feed based on cues 8-12x 24 hr period. Mom to offer breasts and look for signs of milk transfer.  ?2. If unable to latch, Mom to hand express and offer colostrum on spoon 5-7 ml, then try a latch ?3. I and O sheet reviewed.  ?All questions answered at the end of the visit.  ? ?Maternal Data ?Does the patient have breastfeeding experience prior to this delivery?: Yes ?How long did the patient breastfeed?: 13 months at the breast with  breast and formula supplemenation ? ?Feeding ?Mother's Current Feeding Choice: Breast Milk ? ?LATCH Score ?Latch: Repeated attempts needed to sustain latch, nipple held in mouth throughout feeding, stimulation needed to elicit sucking reflex. ? ?Audible Swallowing: Spontaneous and intermittent ? ?Type of Nipple: Everted at rest and after stimulation ? ?Comfort (Breast/Nipple): Soft / non-tender ? ?Hold (Positioning): Assistance needed to correctly position infant at breast and maintain latch. ? ?LATCH Score: 8 ? ? ?Lactation Tools Discussed/Used ?  ? ?Interventions ?Interventions: Breast feeding basics reviewed;Assisted with latch;Skin to skin;Breast massage;Hand express;Breast compression;Adjust position;Support pillows;Position options;Expressed milk;Education;LC Psychologist, educational;Infant Driven Feeding Algorithm education ? ?Discharge ?  ? ?Consult Status ?Consult Status: Follow-up ?Date: 03/14/22 ?Follow-up type: In-patient ? ? ? ?Cecilie Heidel  Nicholson-Springer ?03/13/2022, 2:45 PM ? ? ? ?

## 2022-03-13 NOTE — Lactation Note (Signed)
This note was copied from a baby's chart. ?Lactation Consultation Note ? ?Patient Name: Lindsay Huynh ?Today's Date: 03/13/2022 ?  ?Age:32 hours ? ?Family declined lactation services in L&D due to doula in room assisting with latching.  Lactation to follow up on MBU. ? ? ?[name] ?03/13/2022, 11:29 AM ? ? ? ?

## 2022-03-13 NOTE — Anesthesia Preprocedure Evaluation (Deleted)

## 2022-03-14 LAB — CBC
HCT: 36.7 % (ref 36.0–46.0)
Hemoglobin: 12.6 g/dL (ref 12.0–15.0)
MCH: 31.4 pg (ref 26.0–34.0)
MCHC: 34.3 g/dL (ref 30.0–36.0)
MCV: 91.5 fL (ref 80.0–100.0)
Platelets: 217 10*3/uL (ref 150–400)
RBC: 4.01 MIL/uL (ref 3.87–5.11)
RDW: 12.7 % (ref 11.5–15.5)
WBC: 13.9 10*3/uL — ABNORMAL HIGH (ref 4.0–10.5)
nRBC: 0 % (ref 0.0–0.2)

## 2022-03-14 NOTE — Discharge Summary (Signed)
? ?  Postpartum Discharge Summary ? ?Date of Service March 14, 2022 ? ?   ?Patient Name: Lindsay Huynh ?DOB: Oct 02, 1991 ?MRN: [id number] ? ?Date of admission: 03/13/2022 ?Delivery date:03/13/2022  ?Delivering provider: LOWE, DAVID  ?Date of discharge: 03/14/2022 ? ?Admitting diagnosis: Encounter for induction of labor [Z34.90] ?Intrauterine pregnancy: [redacted]w[redacted]d    ?Secondary diagnosis:  Principal Problem: ?  Encounter for induction of labor ? ?Additional problems: none    ?Discharge diagnosis: Term Pregnancy Delivered                                              ?Post partum procedures: none ?Augmentation: AROM, Pitocin, and Cytotec ?Complications: None ? ?Hospital course: Induction of Labor With Vaginal Delivery   ?31y.o. yo G2P2002 at 424w5das admitted to the hospital 03/13/2022 for induction of labor.  Indication for induction: Postdates.  Patient had an uncomplicated labor course as follows: ?Membrane Rupture Time/Date: 8:48 AM ,03/13/2022   ?Delivery Method:Vaginal, Spontaneous  ?Episiotomy: None  ?Lacerations:  2nd degree  ?Details of delivery can be found in separate delivery note.  Patient had a routine postpartum course. Patient is discharged home 03/14/22. ? ?Newborn Data: ?Birth date:03/13/2022  ?Birth time:10:47 AM  ?Gender:Female  ?Living status:Living  ?Apgars:9 ,9  ?WeDGUYQI:[id number]  ? ?Magnesium Sulfate received: No ?BMZ received: No ?Rhophylac:N/A ?MMR:N/A ?T-DaP:Given prenatally ?Flu: N/A ?Transfusion:No ? ?Physical exam  ?Vitals:  ? 03/13/22 1737 03/13/22 2208 03/14/22 0133 03/14/22 0454  ?BP: (!) 110/57 (!) 105/51 (!) 112/57 (!) 105/57  ?Pulse: 61 67 70 68  ?Resp: '20 18 18 18  ' ?Temp: 98.3 ?F (36.8 ?C) 97.9 ?F (36.6 ?C) 98.4 ?F (36.9 ?C) 98 ?F (36.7 ?C)  ?TempSrc: Oral Oral Oral Oral  ?SpO2: 99% 98% 99% 99%  ?Weight:      ?Height:      ? ?General: alert, cooperative, and no distress ?Lochia: appropriate ?Uterine Fundus: firm ?Incision: Healing well with no significant drainage ?DVT Evaluation: No evidence  of DVT seen on physical exam. ?Labs: ?Lab Results  ?Component Value Date  ? WBC 13.9 (H) 03/14/2022  ? HGB 12.6 03/14/2022  ? HCT 36.7 03/14/2022  ? MCV 91.5 03/14/2022  ? PLT 217 03/14/2022  ? ? ?  Latest Ref Rng & Units 01/05/2019  ?  8:38 AM  ?CMP  ?Total Protein 6.1 - 8.1 g/dL 6.9    ? ?Edinburgh Score: ? ?  03/13/2022  ?  6:55 PM  ?EdFlavia Shipperostnatal Depression Scale Screening Tool  ?I have been able to laugh and see the funny side of things. 0  ?I have looked forward with enjoyment to things. 0  ?I have blamed myself unnecessarily when things went wrong. 0  ?I have been anxious or worried for no good reason. 0  ?I have felt scared or panicky for no good reason. 0  ?Things have been getting on top of me. 0  ?I have been so unhappy that I have had difficulty sleeping. 0  ?I have felt sad or miserable. 0  ?I have been so unhappy that I have been crying. 0  ?The thought of harming myself has occurred to me. 0  ?Edinburgh Postnatal Depression Scale Total 0  ? ? ? ? ?After visit meds:  ?Allergies as of 03/14/2022   ?No Known Allergies ?  ? ?  ?Medication List  ?  ? ?STOP taking  these medications   ? ?ondansetron 4 MG tablet ?Commonly known as: ZOFRAN ?  ? ?  ? ?TAKE these medications   ? ?multivitamin-prenatal 27-0.8 MG Tabs tablet ?Take 1 tablet by mouth daily at 12 noon. ?  ? ?  ? ? ? ?Discharge home in stable condition ?Infant Feeding: Breast ?Infant Disposition:home with mother ?Discharge instruction: per After Visit Summary and Postpartum booklet. ?Activity: Advance as tolerated. Pelvic rest for 6 weeks.  ?Diet: routine diet ?Anticipated Birth Control: Unsure ?Postpartum Appointment:6 weeks ?Additional Postpartum F/U:  not applicable ?Future Appointments:No future appointments. ?Follow up Visit: ? ? ?  ? ?03/14/2022 ?Cyril Mourning, MD ? ? ?

## 2022-03-14 NOTE — Lactation Note (Signed)
This note was copied from a baby's chart. ?Lactation Consultation Note ? ?Patient Name: Lindsay Huynh ?Today's Date: 03/14/2022 ?Reason for consult: Initial assessment;Mother's request;Difficult latch;Term;Breastfeeding assistance ?Age:32 hours ?Mom stated infant at times get cause pain with initial latch. LC worked on Producer, television/film/video with Mom declined, stated comfortable with no pain. ? ?Plan 1 to feed based on cues 8-12x 24hr period.  ?2. Mom to offer breasts and look for signs of milk transfer.  ?3. If unable to latch, hand express and offer colostrum on a spoon then try a latch ? ? ?Mom electric pump at home. We reviewed pumping and milk storage for when she is ready to start supplementing.  ?All questions answered at the end of the visit.  ?Maternal Data ?Has patient been taught Hand Expression?: Yes ? ?Feeding ?Mother's Current Feeding Choice: Breast Milk ? ?LATCH Score ?Latch: Repeated attempts needed to sustain latch, nipple held in mouth throughout feeding, stimulation needed to elicit sucking reflex. ? ?Audible Swallowing: Spontaneous and intermittent ? ?Type of Nipple: Everted at rest and after stimulation ? ?Comfort (Breast/Nipple): Soft / non-tender ? ?Hold (Positioning): Assistance needed to correctly position infant at breast and maintain latch. ? ?LATCH Score: 8 ? ? ?Lactation Tools Discussed/Used ?Tools: Coconut oil ? ?Interventions ?Interventions: Breast feeding basics reviewed;Assisted with latch;Skin to skin;Breast massage;Hand express;Breast compression;Pre-pump if needed;Adjust position;Support pillows;Position options;Expressed milk;Coconut oil;Education;Theme park manager brochure ? ?Discharge ?Discharge Education: Engorgement and breast care;Warning signs for feeding baby ?Pump: Personal ? ?Consult Status ?Consult Status: Complete ?Date: 03/14/22 ? ? ? ?Lindsay Sandoval  Huynh ?03/14/2022, 1:26 PM ? ? ? ?

## 2022-03-21 ENCOUNTER — Telehealth (HOSPITAL_COMMUNITY): Payer: Self-pay

## 2022-03-21 NOTE — Telephone Encounter (Signed)
"  Everything is going well. This is my 2nd delivery so it's a quicker recovery this time. Things are going well." Patient declines questions or concerns about her healing. ? ?"He is eating and sleeping well. At his pediatrician appointment he had gained weight. He sleeps in a bassinet next to my bed." RN reviewed ABC's of safe sleep with patient. Patient declines any questions or concerns about baby. ? ?EPDS score is 1. ? ?Marcelino Duster Shannen [place],RN3,MSN,RNC-MNN ?03/21/2022,1531 ?

## 2022-11-01 ENCOUNTER — Other Ambulatory Visit: Payer: Self-pay | Admitting: Sports Medicine

## 2022-11-01 DIAGNOSIS — M25562 Pain in left knee: Secondary | ICD-10-CM

## 2022-11-12 ENCOUNTER — Ambulatory Visit
Admission: RE | Admit: 2022-11-12 | Discharge: 2022-11-12 | Disposition: A | Discharge status: Home / Self Care | Payer: Managed Care, Other (non HMO) | Source: Ambulatory Visit | Attending: Sports Medicine | Admitting: Sports Medicine

## 2022-11-12 DIAGNOSIS — M25562 Pain in left knee: Secondary | ICD-10-CM
